# Patient Record
Sex: Female | Born: 1945 | Race: White | Hispanic: No | Smoking: Former smoker
Health system: Southern US, Community
[De-identification: ages and names within clinical notes are randomized; demographics above are authoritative.]

## PROBLEM LIST (undated history)

## (undated) DIAGNOSIS — I10 Essential (primary) hypertension: Secondary | ICD-10-CM

## (undated) DIAGNOSIS — N189 Chronic kidney disease, unspecified: Secondary | ICD-10-CM

## (undated) DIAGNOSIS — E785 Hyperlipidemia, unspecified: Secondary | ICD-10-CM

## (undated) DIAGNOSIS — M1710 Unilateral primary osteoarthritis, unspecified knee: Secondary | ICD-10-CM

## (undated) DIAGNOSIS — M109 Gout, unspecified: Secondary | ICD-10-CM

## (undated) DIAGNOSIS — F329 Major depressive disorder, single episode, unspecified: Secondary | ICD-10-CM

## (undated) DIAGNOSIS — F32A Depression, unspecified: Secondary | ICD-10-CM

## (undated) DIAGNOSIS — N3281 Overactive bladder: Secondary | ICD-10-CM

## (undated) DIAGNOSIS — I639 Cerebral infarction, unspecified: Secondary | ICD-10-CM

## (undated) HISTORY — DX: Chronic kidney disease, unspecified: N18.9

## (undated) HISTORY — DX: Hyperlipidemia, unspecified: E78.5

## (undated) HISTORY — PX: MASTECTOMY PARTIAL / LUMPECTOMY: SUR851

## (undated) HISTORY — DX: Gout, unspecified: M10.9

## (undated) HISTORY — DX: Unilateral primary osteoarthritis, unspecified knee: M17.10

## (undated) HISTORY — DX: Major depressive disorder, single episode, unspecified: F32.9

## (undated) HISTORY — DX: Essential (primary) hypertension: I10

## (undated) HISTORY — DX: Cerebral infarction, unspecified: I63.9

## (undated) HISTORY — DX: Overactive bladder: N32.81

## (undated) HISTORY — DX: Depression, unspecified: F32.A

---

## 1985-07-07 HISTORY — PX: ABDOMINAL HYSTERECTOMY: SHX81

## 2006-10-15 ENCOUNTER — Emergency Department: Payer: Self-pay | Admitting: Emergency Medicine

## 2006-10-26 ENCOUNTER — Emergency Department: Payer: Self-pay | Admitting: Emergency Medicine

## 2009-03-07 ENCOUNTER — Ambulatory Visit: Payer: Self-pay | Admitting: Radiation Oncology

## 2009-03-19 ENCOUNTER — Ambulatory Visit: Payer: Self-pay | Admitting: Radiation Oncology

## 2009-04-06 ENCOUNTER — Ambulatory Visit: Payer: Self-pay | Admitting: Radiation Oncology

## 2009-05-07 ENCOUNTER — Ambulatory Visit: Payer: Self-pay | Admitting: Radiation Oncology

## 2009-06-06 ENCOUNTER — Ambulatory Visit: Payer: Self-pay | Admitting: Radiation Oncology

## 2011-01-06 DIAGNOSIS — N3281 Overactive bladder: Secondary | ICD-10-CM | POA: Insufficient documentation

## 2011-01-06 DIAGNOSIS — I10 Essential (primary) hypertension: Secondary | ICD-10-CM | POA: Insufficient documentation

## 2011-01-06 DIAGNOSIS — I69351 Hemiplegia and hemiparesis following cerebral infarction affecting right dominant side: Secondary | ICD-10-CM | POA: Insufficient documentation

## 2011-12-10 DIAGNOSIS — Z853 Personal history of malignant neoplasm of breast: Secondary | ICD-10-CM | POA: Insufficient documentation

## 2012-03-10 DIAGNOSIS — N1832 Chronic kidney disease, stage 3b: Secondary | ICD-10-CM | POA: Insufficient documentation

## 2012-07-14 DIAGNOSIS — M179 Osteoarthritis of knee, unspecified: Secondary | ICD-10-CM

## 2012-07-14 DIAGNOSIS — M1710 Unilateral primary osteoarthritis, unspecified knee: Secondary | ICD-10-CM

## 2012-07-14 DIAGNOSIS — M1711 Unilateral primary osteoarthritis, right knee: Secondary | ICD-10-CM | POA: Insufficient documentation

## 2012-07-14 HISTORY — DX: Osteoarthritis of knee, unspecified: M17.9

## 2012-07-14 HISTORY — DX: Unilateral primary osteoarthritis, unspecified knee: M17.10

## 2016-08-26 ENCOUNTER — Other Ambulatory Visit: Payer: Self-pay | Admitting: Unknown Physician Specialty

## 2016-08-26 DIAGNOSIS — Z780 Asymptomatic menopausal state: Secondary | ICD-10-CM

## 2016-09-17 ENCOUNTER — Ambulatory Visit
Admission: RE | Admit: 2016-09-17 | Discharge: 2016-09-17 | Disposition: A | Discharge status: Home / Self Care | Payer: Medicare Other | Source: Ambulatory Visit | Attending: Unknown Physician Specialty | Admitting: Unknown Physician Specialty

## 2016-09-17 DIAGNOSIS — M85851 Other specified disorders of bone density and structure, right thigh: Secondary | ICD-10-CM | POA: Insufficient documentation

## 2016-09-17 DIAGNOSIS — Z780 Asymptomatic menopausal state: Secondary | ICD-10-CM | POA: Diagnosis not present

## 2016-09-17 DIAGNOSIS — M8588 Other specified disorders of bone density and structure, other site: Secondary | ICD-10-CM | POA: Insufficient documentation

## 2016-09-22 DIAGNOSIS — M8589 Other specified disorders of bone density and structure, multiple sites: Secondary | ICD-10-CM | POA: Insufficient documentation

## 2016-11-19 DIAGNOSIS — J310 Chronic rhinitis: Secondary | ICD-10-CM | POA: Insufficient documentation

## 2016-12-20 LAB — BASIC METABOLIC PANEL
BUN: 20 (ref 4–21)
Creatinine: 1.3 — AB (ref 0.5–1.1)
GLUCOSE: 110
Potassium: 4.1 (ref 3.4–5.3)
SODIUM: 139 (ref 137–147)

## 2016-12-20 LAB — CBC AND DIFFERENTIAL
HCT: 38 (ref 36–46)
HEMOGLOBIN: 12.5 (ref 12.0–16.0)
PLATELETS: 289 (ref 150–399)
WBC: 8.4

## 2016-12-20 LAB — HEPATIC FUNCTION PANEL
ALK PHOS: 91 (ref 25–125)
ALT: 18 (ref 7–35)
AST: 23 (ref 13–35)
BILIRUBIN, TOTAL: 0.8

## 2016-12-21 LAB — BASIC METABOLIC PANEL
BUN: 23 — AB (ref 4–21)
CREATININE: 1.2 — AB (ref 0.5–1.1)
Glucose: 140
Potassium: 4.5 (ref 3.4–5.3)
Sodium: 141 (ref 137–147)

## 2016-12-21 LAB — CBC AND DIFFERENTIAL
HCT: 39 (ref 36–46)
HEMOGLOBIN: 12.4 (ref 12.0–16.0)
PLATELETS: 268 (ref 150–399)
WBC: 6.8

## 2016-12-22 LAB — BASIC METABOLIC PANEL
BUN: 33 — AB (ref 4–21)
CREATININE: 1.1 (ref 0.5–1.1)
GLUCOSE: 131
POTASSIUM: 4.4 (ref 3.4–5.3)
SODIUM: 138 (ref 137–147)

## 2016-12-22 LAB — CBC AND DIFFERENTIAL
HCT: 37 (ref 36–46)
HEMOGLOBIN: 12.1 (ref 12.0–16.0)
PLATELETS: 277 (ref 150–399)
WBC: 11.3

## 2016-12-22 LAB — HEPATIC FUNCTION PANEL
ALT: 18 (ref 7–35)
AST: 20 (ref 13–35)
Alkaline Phosphatase: 88 (ref 25–125)
Bilirubin, Total: 0.3

## 2016-12-23 LAB — BASIC METABOLIC PANEL
BUN: 36 — AB (ref 4–21)
CREATININE: 1.3 — AB (ref 0.5–1.1)
Glucose: 123
POTASSIUM: 4.8 (ref 3.4–5.3)
Sodium: 141 (ref 137–147)

## 2016-12-23 LAB — CBC AND DIFFERENTIAL
HEMATOCRIT: 38 (ref 36–46)
Hemoglobin: 12.1 (ref 12.0–16.0)
PLATELETS: 270 (ref 150–399)
WBC: 8.5

## 2016-12-24 ENCOUNTER — Encounter
Admission: RE | Admit: 2016-12-24 | Discharge: 2016-12-24 | Disposition: A | Discharge status: Home / Self Care | Payer: Medicare Other | Source: Other Acute Inpatient Hospital | Attending: Internal Medicine | Admitting: Internal Medicine

## 2016-12-24 LAB — BASIC METABOLIC PANEL
BUN: 32 — AB (ref 4–21)
Creatinine: 1.1 (ref 0.5–1.1)
Glucose: 99
POTASSIUM: 4.4 (ref 3.4–5.3)
SODIUM: 141 (ref 137–147)

## 2016-12-31 ENCOUNTER — Non-Acute Institutional Stay (SKILLED_NURSING_FACILITY): Payer: Medicare Other | Admitting: Gerontology

## 2016-12-31 ENCOUNTER — Encounter: Payer: Self-pay | Admitting: Gerontology

## 2016-12-31 DIAGNOSIS — G4700 Insomnia, unspecified: Secondary | ICD-10-CM | POA: Diagnosis not present

## 2016-12-31 DIAGNOSIS — M25561 Pain in right knee: Secondary | ICD-10-CM | POA: Diagnosis not present

## 2016-12-31 DIAGNOSIS — N189 Chronic kidney disease, unspecified: Secondary | ICD-10-CM | POA: Insufficient documentation

## 2016-12-31 DIAGNOSIS — E785 Hyperlipidemia, unspecified: Secondary | ICD-10-CM | POA: Insufficient documentation

## 2016-12-31 DIAGNOSIS — I639 Cerebral infarction, unspecified: Secondary | ICD-10-CM | POA: Insufficient documentation

## 2016-12-31 DIAGNOSIS — I10 Essential (primary) hypertension: Secondary | ICD-10-CM | POA: Insufficient documentation

## 2016-12-31 NOTE — Progress Notes (Signed)
Location:   [facility]   Place of Service:  SNF [phone] Provider:  Lorenso QuarryShannon Damary Doland, NP-C  System, Pcp Not In  Patient Care Team: System, Pcp Not In as PCP - General (Unknown Physician Specialty)  Extended Emergency Contact Information Primary Emergency Contact: Antionette CharMace,Kimberly  [place] Home Phone: [phone] Relation: Daughter Secondary Emergency Contact: Allean FoundLawrance,Kari  [place] Home Phone: [phone] Relation: Relative  Code Status:  Full Goals of care: Advanced Directive information Advanced Directives 12/31/2016  Does Patient Have a Medical Advance Directive? No     Chief Complaint  Patient presents with  . Medical Management of Chronic Issues    Routine Visit    HPI:  Pt is a 71 y.o. female seen today for follow up. Pt was admitted to the facility for rehab for knee pain. Pt had received a cortisone injection in the knee. Therefore, she has to wait a few months before having a knee replacement for knee pain. She has been participating in PT and OT while here. PT is recommending a Rolling walker. However, she is adamant she is going to continue to use her Rollator. Pt was insisting on going home earlier today, but has decided to stay for a few more days. She now c/o difficulty sleeping and had requested a sleep aid. Pt reports she is eating well. She has had some weight loss since admit d/t diuretics. VSS. No other complaints.     Past Medical History:  Diagnosis Date  . Breast cancer (HCC)    unspecified  . Chronic kidney disease   . Depression   . Gout, joint   . Hyperlipidemia, unspecified   . Hypertension   . Osteoarthritis of knee 07/14/2012   right  . Overactive bladder   . Stroke [facility](HCC)    Past Surgical History:  Procedure Laterality Date  . ABDOMINAL HYSTERECTOMY  1987   partial  . MASTECTOMY PARTIAL / LUMPECTOMY      Allergies  Allergen Reactions  . Citalopram     Other reaction(s): Blood Disorder Easy  bruising  . Bupropion Anxiety    150 mg.  Has tolerated 75    Allergies as of 12/31/2016      Reactions   Citalopram    Other reaction(s): Blood Disorder Easy bruising   Bupropion Anxiety   150 mg.  Has tolerated 75      Medication List       Accurate as of 12/31/16  4:35 PM. Always use your most recent med list.          acetaminophen 325 MG tablet Commonly known as:  TYLENOL Take 650 mg by mouth every 8 (eight) hours. At 6 am , 2 pm , 10 pm   aspirin EC 81 MG tablet Take 81 mg by mouth daily. At 8 am   colchicine 0.6 MG tablet Take 0.6 mg by mouth 3 (three) times daily as needed.   esomeprazole 40 MG capsule Commonly known as:  NEXIUM Take 40 mg by mouth daily at 6 (six) AM.   fluocinonide cream 0.05 % Commonly known as:  LIDEX Apply 1 application topically 2 times daily as needed to eczema of left hand.   fluticasone 50 MCG/ACT nasal spray Commonly known as:  FLONASE Place 2 sprays into both nostrils daily. 8 am   furosemide 20 MG tablet Commonly known as:  LASIX Take 20 mg by mouth every other day. 8 am   lidocaine 5 % Commonly known as:  LIDODERM  Place 1 patch onto the skin daily. At 8 am to right knee for pain. Remove patch at 8 pm. Remove & Discard patch within 12 hours or as directed by MD   lisinopril 20 MG tablet Commonly known as:  PRINIVIL,ZESTRIL Take 20 mg by mouth daily. At 8 am   magnesium hydroxide 400 MG/5ML suspension Commonly known as:  MILK OF MAGNESIA Take 30 mLs by mouth See admin instructions. Constipation/no BM for 2 days, every 4 hours prn.   pravastatin 40 MG tablet Commonly known as:  PRAVACHOL Take 40 mg by mouth at bedtime. 8 pm   REFRESH 1.4-0.6 % Soln Generic drug:  Polyvinyl Alcohol-Povidone PF Place 2 drops into the right eye 4 (four) times daily. At 8 am, 1 pm, 5 pm, 8 pm   sertraline 25 MG tablet Commonly known as:  ZOLOFT Take 25 mg by mouth daily. 8 am   traZODone 50 MG tablet Commonly known as:   DESYREL Take 50 mg by mouth at bedtime as needed for sleep.       Review of Systems  Constitutional: Negative for activity change, appetite change, chills, diaphoresis and fever.  HENT: Negative for congestion, sneezing, sore throat, trouble swallowing and voice change.   Respiratory: Negative for apnea, cough, choking, chest tightness, shortness of breath and wheezing.   Cardiovascular: Negative for chest pain, palpitations and leg swelling.  Gastrointestinal: Negative for abdominal distention, abdominal pain, constipation, diarrhea and nausea.  Genitourinary: Negative for difficulty urinating, dysuria, frequency and urgency.  Musculoskeletal: Positive for arthralgias (typical arthritis), gait problem and myalgias. Negative for back pain.  Skin: Negative for color change, pallor, rash and wound.  Neurological: Negative for dizziness, tremors, syncope, speech difficulty, weakness, numbness and headaches.  Psychiatric/Behavioral: Negative for agitation and behavioral problems.  All other systems reviewed and are negative.   Immunization History  Administered Date(s) Administered  . DT 10/06/2006  . Influenza, High Dose Seasonal PF 05/05/2014, 03/31/2016  . Influenza,inj,Quad PF,36+ Mos 03/21/2015  . Influenza-Unspecified 07/14/2012  . Pneumococcal Conjugate-13 09/12/2013  . Pneumococcal Polysaccharide-23 09/13/2003, 03/21/2015  . Tdap 01/01/2011   There are no preventive care reminders to display for this patient. No flowsheet data found. Functional Status Survey:    Vitals:   12/31/16 1507  BP: (!) 163/87  Pulse: 85  Resp: 17  Temp: 97.8 F (36.6 C)  SpO2: 100%  Weight: 216 lb 12.8 oz (98.3 kg)  Height: 5\' 1"  (1.549 m)   Body mass index is 40.96 kg/m. Physical Exam  Constitutional: She is oriented to person, place, and time. Vital signs are normal. She appears well-developed and well-nourished. She is active and cooperative. She does not appear ill. No distress.   HENT:  Head: Normocephalic and atraumatic.  Mouth/Throat: Uvula is midline, oropharynx is clear and moist and mucous membranes are normal. Mucous membranes are not pale, not dry and not cyanotic.  Eyes: Conjunctivae, EOM and lids are normal. Pupils are equal, round, and reactive to light.  Neck: Trachea normal, normal range of motion and full passive range of motion without pain. Neck supple. No JVD present. No tracheal deviation, no edema and no erythema present. No thyromegaly present.  Cardiovascular: Normal rate, regular rhythm, normal heart sounds, intact distal pulses and normal pulses.  Exam reveals no gallop, no distant heart sounds and no friction rub.   No murmur heard. Pulses:      Dorsalis pedis pulses are 2+ on the right side, and 2+ on the left side.  2+ BLE  edema  Pulmonary/Chest: Effort normal and breath sounds normal. No accessory muscle usage. No respiratory distress. She has no decreased breath sounds. She has no wheezes. She has no rhonchi. She has no rales. She exhibits no tenderness.  Abdominal: Normal appearance and bowel sounds are normal. She exhibits no distension and no ascites. There is no tenderness.  Musculoskeletal: She exhibits no edema.       Right knee: She exhibits decreased range of motion. Tenderness found.  Expected osteoarthritis, stiffness. Calves soft, supple. Negative Homan's sign  Neurological: She is alert and oriented to person, place, and time. She has normal strength.  Skin: Skin is warm, dry and intact. No rash noted. She is not diaphoretic. No cyanosis or erythema. No pallor. Nails show no clubbing.  Psychiatric: She has a normal mood and affect. Her speech is normal and behavior is normal. Judgment and thought content normal. Cognition and memory are normal.  Nursing note and vitals reviewed.   Labs reviewed:  Recent Labs  12/22/16 12/23/16 12/24/16  NA 138 141 141  K 4.4 4.8 4.4  BUN 33* 36* 32*  CREATININE 1.1 1.3* 1.1    Recent  Labs  12/20/16 12/22/16  AST 23 20  ALT 18 18  ALKPHOS 91 88    Recent Labs  12/21/16 12/22/16 12/23/16  WBC 6.8 11.3 8.5  HGB 12.4 12.1 12.1  HCT 39 37 38  PLT 268 277 270   No results found for: TSH No results found for: HGBA1C No results found for: CHOL, HDL, LDLCALC, LDLDIRECT, TRIG, CHOLHDL  Significant Diagnostic Results in last 30 days:  No results found.  Assessment/Plan 1. Right knee pain, unspecified chronicity  Continue PT/OT  Continue exercises as taught by PT/OT  Continue Lidocaine 5% patch to the right knee Q Day, Remove after 12 hours.  Ice prn for pain, edema  2. Insomnia, unspecified type  Trazodone 50 mg po Q HS prn  Family/ staff Communication:   Total Time:  Documentation:  Face to Face:  Family/Phone:   Labs/tests ordered:    Medication list reviewed and assessed for continued appropriateness. Monthly medication orders reviewed and signed.  Brynda Rim, NP-C Geriatrics [facility] [phone] [place], [place] [address] Cell Phone (Mon-Fri 8am-5pm):  [phone] On Call:  [phone] & follow prompts after 5pm & weekends Office Phone:  [phone] Office Fax:  [phone]

## 2017-01-04 ENCOUNTER — Encounter
Admission: RE | Admit: 2017-01-04 | Discharge: 2017-01-04 | Disposition: A | Discharge status: Home / Self Care | Payer: Medicare Other | Source: Ambulatory Visit | Attending: Internal Medicine | Admitting: Internal Medicine

## 2017-01-06 ENCOUNTER — Non-Acute Institutional Stay (SKILLED_NURSING_FACILITY): Payer: Medicare Other | Admitting: Gerontology

## 2017-01-06 DIAGNOSIS — M25561 Pain in right knee: Secondary | ICD-10-CM

## 2017-01-06 DIAGNOSIS — G4700 Insomnia, unspecified: Secondary | ICD-10-CM

## 2017-01-08 ENCOUNTER — Encounter: Payer: Self-pay | Admitting: Gerontology

## 2017-01-08 NOTE — Progress Notes (Signed)
Location:   [facility] Room Number: 201A Place of Service:  SNF [phone])  Provider: Lorenso Quarry, NP-C  PCP: Lauro Regulus, MD Patient Care Team: Lauro Regulus, MD as PCP - General (Internal Medicine)  Extended Emergency Contact Information Primary Emergency Contact: Antionette Char [place] Home Phone: [phone] Relation: Daughter Secondary Emergency Contact: Allean Found [place] Home Phone: [phone] Mobile Phone: [phone] Relation: Daughter  Code Status: Full Goals of care:  Advanced Directive information Advanced Directives 01/06/2017  Does Patient Have a Medical Advance Directive? No     Allergies  Allergen Reactions  . Citalopram     Other reaction(s): Blood Disorder Easy bruising  . Bupropion Anxiety    150 mg.  Has tolerated 75    Chief Complaint  Patient presents with  . Discharge Note    Discharged from SNF    HPI:  71 y.o. female seen today for discharge evaluation. Pt was admitted to the facility for rehab for knee pain. Pt had received a cortisone injection in the knee. Therefore, she has to wait a few months before having a knee replacement for knee pain. She has been participating in PT and OT while here. PT is recommending a Rolling walker. However, she is adamant she is going to continue to use her Rollator. She had c/o difficulty sleeping and had requested a sleep aid. Pt reports she is eating well. She has had some weight loss since admit d/t diuretics. Pt reports she feels she is ready for discharge. VSS. No other complaints.     Past Medical History:  Diagnosis Date  . Breast cancer (HCC)    unspecified  . Chronic kidney disease   . Depression   . Gout, joint   . Hyperlipidemia, unspecified   . Hypertension   . Osteoarthritis of knee 07/14/2012   right  . Overactive bladder   . Stroke [facility])     Past Surgical History:  Procedure Laterality Date  .  ABDOMINAL HYSTERECTOMY  1987   partial  . MASTECTOMY PARTIAL / LUMPECTOMY        reports that she quit smoking about 14 years ago. Her smoking use included Cigarettes. She has a 30.00 pack-year smoking history. She has never used smokeless tobacco. Her alcohol and drug histories are not on file. Social History   Social History  . Marital status: Divorced    Spouse name: N/A  . Number of children: N/A  . Years of education: N/A   Occupational History  . Not on file.   Social History Main Topics  . Smoking status: Former Smoker    Packs/day: 1.50    Years: 20.00    Types: Cigarettes    Quit date: 04/23/2002  . Smokeless tobacco: Never Used  . Alcohol use Not on file  . Drug use: Unknown  . Sexual activity: Not on file   Other Topics Concern  . Not on file   Social History Narrative  . No narrative on file   Functional Status Survey:    Allergies  Allergen Reactions  . Citalopram     Other reaction(s): Blood Disorder Easy bruising  . Bupropion Anxiety    150 mg.  Has tolerated 75    There are no preventive care reminders to display for this patient.  Medications: Allergies as of 01/06/2017      Reactions   Citalopram    Other reaction(s): Blood Disorder Easy bruising   Bupropion Anxiety  150 mg.  Has tolerated 75      Medication List       Accurate as of 01/06/17 11:59 PM. Always use your most recent med list.          aspirin EC 81 MG tablet Take 81 mg by mouth daily. At 8 am   colchicine 0.6 MG tablet Take 0.6 mg by mouth 3 (three) times daily as needed.   esomeprazole 40 MG capsule Commonly known as:  NEXIUM Take 40 mg by mouth daily at 6 (six) AM.   fluocinonide cream 0.05 % Commonly known as:  LIDEX Apply 1 application topically 2 times daily as needed to eczema of left hand.   fluticasone 50 MCG/ACT nasal spray Commonly known as:  FLONASE Place 2 sprays into both nostrils daily. 8 am   furosemide 20 MG tablet Commonly known as:   LASIX Take 20 mg by mouth every other day. 8 am   lidocaine 5 % Commonly known as:  LIDODERM Place 1 patch onto the skin daily. At 8 am to right knee for pain. Remove patch at 8 pm. Remove & Discard patch within 12 hours or as directed by MD   lisinopril 20 MG tablet Commonly known as:  PRINIVIL,ZESTRIL Take 20 mg by mouth daily. At 8 am   magnesium hydroxide 400 MG/5ML suspension Commonly known as:  MILK OF MAGNESIA Take 30 mLs by mouth See admin instructions. Constipation/no BM for 2 days, every 4 hours prn.   pravastatin 40 MG tablet Commonly known as:  PRAVACHOL Take 40 mg by mouth at bedtime. 8 pm   REFRESH 1.4-0.6 % Soln Generic drug:  Polyvinyl Alcohol-Povidone PF Place 2 drops into the right eye 4 (four) times daily. At 8 am, 1 pm, 5 pm, 8 pm   sertraline 25 MG tablet Commonly known as:  ZOLOFT Take 25 mg by mouth daily. 8 am   traZODone 50 MG tablet Commonly known as:  DESYREL Take 50 mg by mouth at bedtime as needed for sleep.       Review of Systems  Constitutional: Negative for activity change, appetite change, chills, diaphoresis and fever.  HENT: Negative for congestion, sneezing, sore throat, trouble swallowing and voice change.   Respiratory: Negative for apnea, cough, choking, chest tightness, shortness of breath and wheezing.   Cardiovascular: Negative for chest pain, palpitations and leg swelling.  Gastrointestinal: Negative for abdominal distention, abdominal pain, constipation, diarrhea and nausea.  Genitourinary: Negative for difficulty urinating, dysuria, frequency and urgency.  Musculoskeletal: Positive for arthralgias (typical arthritis), gait problem and myalgias. Negative for back pain.  Skin: Negative for color change, pallor, rash and wound.  Neurological: Negative for dizziness, tremors, syncope, speech difficulty, weakness, numbness and headaches.  Psychiatric/Behavioral: Negative for agitation and behavioral problems.  All other systems  reviewed and are negative.   Vitals:   01/06/17 0941  BP: 116/74  Pulse: 66  Resp: 20  Temp: 98.4 F (36.9 C)  SpO2: 100%  Weight: 223 lb (101.2 kg)  Height: 5\' 1"  (1.549 m)   Body mass index is 42.14 kg/m. Physical Exam  Constitutional: She is oriented to person, place, and time. Vital signs are normal. She appears well-developed and well-nourished. She is active and cooperative. She does not appear ill. No distress.  HENT:  Head: Normocephalic and atraumatic.  Mouth/Throat: Uvula is midline, oropharynx is clear and moist and mucous membranes are normal. Mucous membranes are not pale, not dry and not cyanotic.  Eyes: Conjunctivae, EOM and lids  are normal. Pupils are equal, round, and reactive to light.  Neck: Trachea normal, normal range of motion and full passive range of motion without pain. Neck supple. No JVD present. No tracheal deviation, no edema and no erythema present. No thyromegaly present.  Cardiovascular: Normal rate, regular rhythm, normal heart sounds, intact distal pulses and normal pulses.  Exam reveals no gallop, no distant heart sounds and no friction rub.   No murmur heard. Pulses:      Dorsalis pedis pulses are 2+ on the right side, and 2+ on the left side.  2+ BLE edema  Pulmonary/Chest: Effort normal and breath sounds normal. No accessory muscle usage. No respiratory distress. She has no decreased breath sounds. She has no wheezes. She has no rhonchi. She has no rales. She exhibits no tenderness.  Abdominal: Normal appearance and bowel sounds are normal. She exhibits no distension and no ascites. There is no tenderness.  Musculoskeletal: She exhibits no edema.       Right knee: She exhibits decreased range of motion. Tenderness found.  Expected osteoarthritis, stiffness. Calves soft, supple. Negative Homan's sign  Neurological: She is alert and oriented to person, place, and time. She has normal strength.  Skin: Skin is warm, dry and intact. No rash noted.  She is not diaphoretic. No cyanosis or erythema. No pallor. Nails show no clubbing.  Psychiatric: She has a normal mood and affect. Her speech is normal and behavior is normal. Judgment and thought content normal. Cognition and memory are normal.  Nursing note and vitals reviewed.   Labs reviewed: Basic Metabolic Panel:  Recent Labs  16/10/96 12/23/16 12/24/16  NA 138 141 141  K 4.4 4.8 4.4  BUN 33* 36* 32*  CREATININE 1.1 1.3* 1.1   Liver Function Tests:  Recent Labs  12/20/16 12/22/16  AST 23 20  ALT 18 18  ALKPHOS 91 88   No results for input(s): LIPASE, AMYLASE in the last 8760 hours. No results for input(s): AMMONIA in the last 8760 hours. CBC:  Recent Labs  12/21/16 12/22/16 12/23/16  WBC 6.8 11.3 8.5  HGB 12.4 12.1 12.1  HCT 39 37 38  PLT 268 277 270   Cardiac Enzymes: No results for input(s): CKTOTAL, CKMB, CKMBINDEX, TROPONINI in the last 8760 hours. BNP: Invalid input(s): POCBNP CBG: No results for input(s): GLUCAP in the last 8760 hours.  Procedures and Imaging Studies During Stay: No results found.  Assessment/Plan:   1. Right knee pain, unspecified chronicity  Continue PT/OT  Continue exercises as taught by PT/OT  Continue Lidocaine 5% patch to the right knee Q Day, Remove after 12 hours.  Ice prn for pain, edema  Follow up with orthopedist asap after discharge for continuity of care  2. Insomnia, unspecified type Trazodone 50 mg po Q HS prn   Patient is being discharged with the following home health services: HHPT/OT    Patient is being discharged with the following durable medical equipment: rolling walker   Patient has been advised to f/u with their PCP in 1-2 weeks to bring them up to date on their rehab stay.  Social services at facility was responsible for arranging this appointment.  Pt was provided with a 30 day supply of prescriptions for medications and refills must be obtained from their PCP.  For controlled substances, a more  limited supply may be provided adequate until PCP appointment only.  Future labs/tests needed:    Family/ staff Communication:   Total Time:  Documentation:  Face to  Face:  Family/Phone:  Brynda RimShannon H. Amran Malter, NP-C Geriatrics [facility] [phone] [address] [place], [place] [id number] Cell Phone (Mon-Fri 8am-5pm):  [phone] On Call:  [phone] & follow prompts after 5pm & weekends Office Phone:  [phone] Office Fax:  [phone]

## 2017-02-26 ENCOUNTER — Emergency Department: Payer: Medicare Other

## 2017-02-26 ENCOUNTER — Emergency Department
Admission: EM | Admit: 2017-02-26 | Discharge: 2017-02-26 | Disposition: A | Discharge status: Home / Self Care | Payer: Medicare Other | Attending: Student in an Organized Health Care Education/Training Program | Admitting: Student in an Organized Health Care Education/Training Program

## 2017-02-26 ENCOUNTER — Encounter: Payer: Self-pay | Admitting: Emergency Medicine

## 2017-02-26 DIAGNOSIS — R1031 Right lower quadrant pain: Secondary | ICD-10-CM | POA: Insufficient documentation

## 2017-02-26 DIAGNOSIS — Z7982 Long term (current) use of aspirin: Secondary | ICD-10-CM | POA: Diagnosis not present

## 2017-02-26 DIAGNOSIS — Z79899 Other long term (current) drug therapy: Secondary | ICD-10-CM | POA: Diagnosis not present

## 2017-02-26 DIAGNOSIS — Z87891 Personal history of nicotine dependence: Secondary | ICD-10-CM | POA: Diagnosis not present

## 2017-02-26 DIAGNOSIS — I129 Hypertensive chronic kidney disease with stage 1 through stage 4 chronic kidney disease, or unspecified chronic kidney disease: Secondary | ICD-10-CM | POA: Diagnosis not present

## 2017-02-26 DIAGNOSIS — N189 Chronic kidney disease, unspecified: Secondary | ICD-10-CM | POA: Diagnosis not present

## 2017-02-26 LAB — COMPREHENSIVE METABOLIC PANEL
ALBUMIN: 4 g/dL (ref 3.5–5.0)
ALK PHOS: 92 U/L (ref 38–126)
ALT: 15 U/L (ref 14–54)
AST: 19 U/L (ref 15–41)
Anion gap: 7 (ref 5–15)
BILIRUBIN TOTAL: 0.5 mg/dL (ref 0.3–1.2)
BUN: 21 mg/dL — AB (ref 6–20)
CALCIUM: 9.3 mg/dL (ref 8.9–10.3)
CO2: 29 mmol/L (ref 22–32)
CREATININE: 1.3 mg/dL — AB (ref 0.44–1.00)
Chloride: 103 mmol/L (ref 101–111)
GFR calc Af Amer: 47 mL/min — ABNORMAL LOW (ref >60)
GFR calc non Af Amer: 40 mL/min — ABNORMAL LOW (ref >60)
GLUCOSE: 122 mg/dL — AB (ref 65–99)
Potassium: 4.3 mmol/L (ref 3.5–5.1)
SODIUM: 139 mmol/L (ref 135–145)
Total Protein: 7.5 g/dL (ref 6.5–8.1)

## 2017-02-26 LAB — URINALYSIS, COMPLETE (UACMP) WITH MICROSCOPIC
Bacteria, UA: NONE SEEN
Bilirubin Urine: NEGATIVE
GLUCOSE, UA: NEGATIVE mg/dL
HGB URINE DIPSTICK: NEGATIVE
Ketones, ur: NEGATIVE mg/dL
Leukocytes, UA: NEGATIVE
Nitrite: NEGATIVE
PH: 6 (ref 5.0–8.0)
Protein, ur: 30 mg/dL — AB
SPECIFIC GRAVITY, URINE: 1.021 (ref 1.005–1.030)

## 2017-02-26 LAB — CBC
HCT: 39.8 % (ref 35.0–47.0)
Hemoglobin: 13.2 g/dL (ref 12.0–16.0)
MCH: 30.5 pg (ref 26.0–34.0)
MCHC: 33.1 g/dL (ref 32.0–36.0)
MCV: 92.2 fL (ref 80.0–100.0)
PLATELETS: 231 10*3/uL (ref 150–440)
RBC: 4.32 MIL/uL (ref 3.80–5.20)
RDW: 12.9 % (ref 11.5–14.5)
WBC: 6.2 10*3/uL (ref 3.6–11.0)

## 2017-02-26 LAB — LIPASE, BLOOD: Lipase: 45 U/L (ref 11–51)

## 2017-02-26 MED ORDER — SODIUM CHLORIDE 0.9 % IV BOLUS (SEPSIS)
1000.0000 mL | Freq: Once | INTRAVENOUS | Status: AC
  Administered 2017-02-26: 1000 mL via INTRAVENOUS

## 2017-02-26 MED ORDER — FENTANYL CITRATE (PF) 100 MCG/2ML IJ SOLN
INTRAMUSCULAR | Status: AC
  Filled 2017-02-26: qty 2

## 2017-02-26 MED ORDER — IOPAMIDOL (ISOVUE-370) INJECTION 76%
75.0000 mL | Freq: Once | INTRAVENOUS | Status: AC | PRN
  Administered 2017-02-26: 75 mL via INTRAVENOUS

## 2017-02-26 MED ORDER — FENTANYL CITRATE (PF) 100 MCG/2ML IJ SOLN
50.0000 ug | Freq: Once | INTRAMUSCULAR | Status: AC
Start: 2017-02-26 — End: 2017-02-26
  Administered 2017-02-26: 50 ug via INTRAVENOUS

## 2017-02-26 MED ORDER — POLYETHYLENE GLYCOL 3350 17 G PO PACK: 17.0000 g | PACK | Freq: Every day | ORAL | 0 refills | Status: DC

## 2017-02-26 MED ORDER — TRAMADOL HCL 50 MG PO TABS: 50.0000 mg | ORAL_TABLET | Freq: Four times a day (QID) | ORAL | 0 refills | Status: DC | PRN

## 2017-02-26 MED ORDER — LIDOCAINE 5 % EX PTCH: 1.0000 | MEDICATED_PATCH | Freq: Two times a day (BID) | CUTANEOUS | 0 refills | Status: AC

## 2017-02-26 NOTE — ED Notes (Signed)
First nurse note: Pt arrived via EMS from home for reports of LLQ abdominal pain for 3-4 days with associated loose stools. Pt reports has been taking tramadol for 15 days but stopped last night. EMS reports VSS.

## 2017-02-26 NOTE — Discharge Instructions (Signed)

## 2017-02-26 NOTE — ED Notes (Signed)
Patient transported to CT 

## 2017-02-26 NOTE — ED Triage Notes (Signed)
Pt with right abd pain that started 1 week ago. Pain has increased and now is radiating down right leg. Pt hx stroke affecting right side 2006.

## 2017-02-26 NOTE — ED Provider Notes (Signed)
[facility] Emergency Department Provider Note    First MD Initiated Contact with Patient 02/26/17 1940     (approximate)  I have reviewed the triage vital signs and the nursing notes.   HISTORY  Chief Complaint Abdominal Pain    HPI Doris Curry is a 71 y.o. female Presents with chief complaint of one week of right sided, sharp, lower abdominal pain radiating into the top part of her right thigh. States the pain has been relatively constant and is worse with movement. States that she went 3 days without moving her bowels but had diarrhea today. No fevers. No nausea or vomiting. No chest pain or shortness of breath. No history of kidney stones. No dysuria or hematuria. No blood in her stools.   Past Medical History:  Diagnosis Date  . Chronic kidney disease   . Depression   . Gout, joint   . Hyperlipidemia, unspecified   . Hypertension   . Osteoarthritis of knee 07/14/2012   right  . Overactive bladder   . Stroke [facility])    Family History  Problem Relation Age of Onset  . Breast cancer Mother   . Alzheimer's disease Mother   . Hypertension Father   . Kidney disease Father   . Stroke Father   . Stroke Paternal Grandfather   . Diabetes type II Paternal Grandfather   . Diabetes type II Paternal Aunt   . Diabetes type II Daughter    Past Surgical History:  Procedure Laterality Date  . ABDOMINAL HYSTERECTOMY  1987   partial  . MASTECTOMY PARTIAL / LUMPECTOMY     Patient Active Problem List   Diagnosis Date Noted  . Hypertension   . Chronic kidney disease   . Hyperlipidemia, unspecified   . Stroke [facility])       Prior to Admission medications   Medication Sig Start Date End Date Taking? Authorizing Provider  aspirin EC 81 MG tablet Take 81 mg by mouth daily. At 8 am    [provider]  colchicine 0.6 MG tablet Take 0.6 mg by mouth 3 (three) times daily as needed.    [provider]  esomeprazole (NEXIUM) 40 MG capsule  Take 40 mg by mouth daily at 6 (six) AM.    [provider]  fluocinonide cream (LIDEX) 0.05 % Apply 1 application topically 2 times daily as needed to eczema of left hand.    [provider]  fluticasone (FLONASE) 50 MCG/ACT nasal spray Place 2 sprays into both nostrils daily. 8 am    [provider]  furosemide (LASIX) 20 MG tablet Take 20 mg by mouth every other day. 8 am    [provider]  lidocaine (LIDODERM) 5 % Place 1 patch onto the skin daily. At 8 am to right knee for pain. Remove patch at 8 pm. Remove & Discard patch within 12 hours or as directed by MD    [provider]  lidocaine (LIDODERM) 5 % Place 1 patch onto the skin every 12 (twelve) hours. Remove & Discard patch within 12 hours or as directed by MD 02/26/17 02/26/18  Willy Eddy, MD  lisinopril (PRINIVIL,ZESTRIL) 20 MG tablet Take 20 mg by mouth daily. At 8 am    [provider]  magnesium hydroxide (MILK OF MAGNESIA) 400 MG/5ML suspension Take 30 mLs by mouth See admin instructions. Constipation/no BM for 2 days, every 4 hours prn.    [provider]  polyethylene glycol (MIRALAX / GLYCOLAX) packet Take  17 g by mouth daily. Mix one tablespoon with 8oz of your favorite juice or water every day until you are having soft formed stools. Then start taking once daily if you didn't have a stool the day before. 02/26/17   Willy Eddy, MD  Polyvinyl Alcohol-Povidone PF (REFRESH) 1.4-0.6 % SOLN Place 2 drops into the right eye 4 (four) times daily. At 8 am, 1 pm, 5 pm, 8 pm    [provider]  pravastatin (PRAVACHOL) 40 MG tablet Take 40 mg by mouth at bedtime. 8 pm    [provider]  sertraline (ZOLOFT) 25 MG tablet Take 25 mg by mouth daily. 8 am    [provider]  traMADol (ULTRAM) 50 MG tablet Take 1 tablet (50 mg total) by mouth every 6 (six) hours as needed. 02/26/17 02/26/18  Willy Eddy, MD  traZODone (DESYREL) 50 MG tablet Take  50 mg by mouth at bedtime as needed for sleep.    [provider]    Allergies Citalopram and Bupropion    Social History Social History  Substance Use Topics  . Smoking status: Former Smoker    Packs/day: 1.50    Years: 20.00    Types: Cigarettes    Quit date: 04/23/2002  . Smokeless tobacco: Never Used  . Alcohol use No    Review of Systems Patient denies headaches, rhinorrhea, blurry vision, numbness, shortness of breath, chest pain, edema, cough, abdominal pain, nausea, vomiting, diarrhea, dysuria, fevers, rashes or hallucinations unless otherwise stated above in HPI. ____________________________________________   PHYSICAL EXAM:  VITAL SIGNS: Vitals:   02/26/17 2030 02/26/17 2100  BP: (!) 151/76 (!) 158/97  Pulse:  86  Temp:    SpO2:  97%    Constitutional: Alert and oriented. Well appearing and in no acute distress. Eyes: Conjunctivae are normal.  Head: Atraumatic. Nose: No congestion/rhinnorhea. Mouth/Throat: Mucous membranes are moist.   Neck: No stridor. Painless ROM.  Cardiovascular: Normal rate, regular rhythm. Grossly normal heart sounds.  Good peripheral circulation. Respiratory: Normal respiratory effort.  No retractions. Lungs CTAB. Gastrointestinal: Soft, obese with ttp of RLQ ttp. No distention. No abdominal bruits. No CVA tenderness. No rashes, no hernia Genitourinary:  Musculoskeletal: No lower extremity tenderness nor edema.  No joint effusions. Neurologic:  Normal speech and language. No gross focal neurologic deficits are appreciated. No facial droop Skin:  Skin is warm, dry and intact. No rash noted. Psychiatric: Mood and affect are normal. Speech and behavior are normal.  ____________________________________________   LABS (all labs ordered are listed, but only abnormal results are displayed)  Results for orders placed or performed during the hospital encounter of 02/26/17 (from the past 24 hour(s))  Lipase, blood     Status:  None   Collection Time: 02/26/17  6:38 PM  Result Value Ref Range   Lipase 45 11 - 51 U/L  Comprehensive metabolic panel     Status: Abnormal   Collection Time: 02/26/17  6:38 PM  Result Value Ref Range   Sodium 139 135 - 145 mmol/L   Potassium 4.3 3.5 - 5.1 mmol/L   Chloride 103 101 - 111 mmol/L   CO2 29 22 - 32 mmol/L   Glucose, Bld 122 (H) 65 - 99 mg/dL   BUN 21 (H) 6 - 20 mg/dL   Creatinine, Ser 9.79 (H) 0.44 - 1.00 mg/dL   Calcium 9.3 8.9 - 89.2 mg/dL   Total Protein 7.5 6.5 - 8.1 g/dL   Albumin 4.0 3.5 - 5.0 g/dL  AST 19 15 - 41 U/L   ALT 15 14 - 54 U/L   Alkaline Phosphatase 92 38 - 126 U/L   Total Bilirubin 0.5 0.3 - 1.2 mg/dL   GFR calc non Af Amer 40 (L) >60 mL/min   GFR calc Af Amer 47 (L) >60 mL/min   Anion gap 7 5 - 15  CBC     Status: None   Collection Time: 02/26/17  6:38 PM  Result Value Ref Range   WBC 6.2 3.6 - 11.0 K/uL   RBC 4.32 3.80 - 5.20 MIL/uL   Hemoglobin 13.2 12.0 - 16.0 g/dL   HCT 16.1 09.6 - 04.5 %   MCV 92.2 80.0 - 100.0 fL   MCH 30.5 26.0 - 34.0 pg   MCHC 33.1 32.0 - 36.0 g/dL   RDW 40.9 81.1 - 91.4 %   Platelets 231 150 - 440 K/uL  Urinalysis, Complete w Microscopic     Status: Abnormal   Collection Time: 02/26/17  6:38 PM  Result Value Ref Range   Color, Urine YELLOW (A) YELLOW   APPearance CLEAR (A) CLEAR   Specific Gravity, Urine 1.021 1.005 - 1.030   pH 6.0 5.0 - 8.0   Glucose, UA NEGATIVE NEGATIVE mg/dL   Hgb urine dipstick NEGATIVE NEGATIVE   Bilirubin Urine NEGATIVE NEGATIVE   Ketones, ur NEGATIVE NEGATIVE mg/dL   Protein, ur 30 (A) NEGATIVE mg/dL   Nitrite NEGATIVE NEGATIVE   Leukocytes, UA NEGATIVE NEGATIVE   RBC / HPF 0-5 0 - 5 RBC/hpf   WBC, UA 0-5 0 - 5 WBC/hpf   Bacteria, UA NONE SEEN NONE SEEN   Squamous Epithelial / LPF 0-5 (A) NONE SEEN   ____________________________________________  ____________________________________________  RADIOLOGY  I personally reviewed all radiographic images ordered to evaluate for  the above acute complaints and reviewed radiology reports and findings.  These findings were personally discussed with the patient.  Please see medical record for radiology report.  ____________________________________________   PROCEDURES  Procedure(s) performed:  Procedures    Critical Care performed: no ____________________________________________   INITIAL IMPRESSION / ASSESSMENT AND PLAN / ED COURSE  Pertinent labs & imaging results that were available during my care of the patient were reviewed by me and considered in my medical decision making (see chart for details).  DDX: appy, AAA< stone, uti, colitis, hernia, msk strain, neuropathy,  Doris Curry is a 71 y.o. who presents to the ED with right lower quadrant abdominal pain as described as above. She otherwise well-appearing and in no acute distress. Blood reassuring. N EVIDENCE OF UTI or hemNo leukocytosis. CT imaging ordered for the above differential shows no acute abnormality. I did discuss the findings with relation to the pancreas and liver with the patient. Repeat abdominal exam is soft and benign. Spine do feel that she is stable for follow-up with PCP.  Patient was able to tolerate PO and was able to ambulate with a steady gait.       ____________________________________________   FINAL CLINICAL IMPRESSION(S) / ED DIAGNOSES  Final diagnoses:  Right lower quadrant abdominal pain      NEW MEDICATIONS STARTED DURING THIS VISIT:  New Prescriptions   LIDOCAINE (LIDODERM) 5 %    Place 1 patch onto the skin every 12 (twelve) hours. Remove & Discard patch within 12 hours or as directed by MD   POLYETHYLENE GLYCOL (MIRALAX / GLYCOLAX) PACKET    Take 17 g by mouth daily. Mix one tablespoon with 8oz of your favorite juice  or water every day until you are having soft formed stools. Then start taking once daily if you didn't have a stool the day before.   TRAMADOL (ULTRAM) 50 MG TABLET    Take 1 tablet (50 mg total)  by mouth every 6 (six) hours as needed.     Note:  This document was prepared using Dragon voice recognition software and may include unintentional dictation errors.    Willy Eddy, MD 02/26/17 2156

## 2017-02-26 NOTE — ED Notes (Signed)

## 2017-04-07 ENCOUNTER — Other Ambulatory Visit (INDEPENDENT_AMBULATORY_CARE_PROVIDER_SITE_OTHER): Payer: Self-pay | Admitting: Vascular Surgery

## 2017-04-07 DIAGNOSIS — M7989 Other specified soft tissue disorders: Secondary | ICD-10-CM

## 2017-04-08 ENCOUNTER — Ambulatory Visit (INDEPENDENT_AMBULATORY_CARE_PROVIDER_SITE_OTHER): Payer: Medicare Other

## 2017-04-08 DIAGNOSIS — M7989 Other specified soft tissue disorders: Secondary | ICD-10-CM | POA: Diagnosis not present

## 2017-04-09 ENCOUNTER — Encounter (INDEPENDENT_AMBULATORY_CARE_PROVIDER_SITE_OTHER): Payer: Self-pay | Admitting: Vascular Surgery

## 2017-04-09 ENCOUNTER — Ambulatory Visit (INDEPENDENT_AMBULATORY_CARE_PROVIDER_SITE_OTHER): Payer: Medicare Other | Admitting: Vascular Surgery

## 2017-04-09 VITALS — BP 127/71 | HR 77 | Resp 16 | Ht 61.0 in | Wt 219.0 lb

## 2017-04-09 DIAGNOSIS — I159 Secondary hypertension, unspecified: Secondary | ICD-10-CM

## 2017-04-09 DIAGNOSIS — N189 Chronic kidney disease, unspecified: Secondary | ICD-10-CM | POA: Diagnosis not present

## 2017-04-09 DIAGNOSIS — R600 Localized edema: Secondary | ICD-10-CM

## 2017-04-09 DIAGNOSIS — E785 Hyperlipidemia, unspecified: Secondary | ICD-10-CM | POA: Diagnosis not present

## 2017-04-09 NOTE — Progress Notes (Signed)
Subjective:    Patient ID: Doris Curry, female    DOB: 21-May-1946, 71 y.o.   MRN: [id number] Chief Complaint  Patient presents with  . New Patient (Initial Visit)    LE edema   Presents as a new patient referred by Dr. Ernest Pine. Patient will be undergoing a total knee replacement and Dr. Ernest Pine would like her optimized. Patient presents with a chief complaint of bilateral lower extremity edema. States this edema has been intermittent for many years. States her edema has progressive the point she will weep fluid from her legs. She denies any open wounds at this time. The patient sleeps sitting in a chair in a dependent position. She is in the process of requiring a hospital bed. Patient with minimal ambulation at this time. Patient states the edema is associated with discomfort. Her edema and discomfort have progressive the point she is unable to function on a daily basis. Patient states she is unable to wear compression stockings as they are too painful. She also has trouble putting them on. She does try to engage and elevation of the best she can. Patient underwent a bilateral ABI which was notable for triphasic tibials bilaterally and no significant lower extremity arterial disease. Bilateral toe brachial indices were normal. Patient denies any fever, nausea or vomiting.   Review of Systems  Constitutional: Negative.   HENT: Negative.   Eyes: Negative.   Respiratory: Negative.   Cardiovascular: Positive for leg swelling.  Gastrointestinal: Negative.   Endocrine: Negative.   Genitourinary: Negative.   Musculoskeletal: Negative.   Skin: Negative.   Allergic/Immunologic: Negative.   Neurological: Negative.   Hematological: Negative.   Psychiatric/Behavioral: Negative.       Objective:   Physical Exam  Constitutional: She is oriented to person, place, and time. She appears well-developed and well-nourished. No distress.  Obese  HENT:  Head: Normocephalic and atraumatic.  Eyes:  Pupils are equal, round, and reactive to light. Conjunctivae are normal.  Neck: Normal range of motion.  Cardiovascular: Normal rate, regular rhythm, normal heart sounds and intact distal pulses.   Pulses:      Radial pulses are 2+ on the right side, and 2+ on the left side.  Hard to palpate pedal pulses however her bilateral feet are warm  Pulmonary/Chest: Effort normal.  Musculoskeletal: She exhibits edema (Moderate bilateral 1+ pitting edema noted).  Neurological: She is alert and oriented to person, place, and time.  Skin: Skin is warm and dry. She is not diaphoretic.  Right lower extremity: Small scattered scabs noted along the front of the shin Left lower extremity: No open wounds Stasis dermatitis to the bilateral lower extremity  Psychiatric: She has a normal mood and affect. Her behavior is normal. Judgment and thought content normal.  Vitals reviewed.  BP 127/71 (BP Location: Right Arm)   Pulse 77   Resp 16   Ht  (1.549 m)   Wt 219 lb (99.3 kg)   BMI 41.38 kg/m   Past Medical History:  Diagnosis Date  . Chronic kidney disease   . Depression   . Gout, joint   . Hyperlipidemia, unspecified   . Hypertension   . Osteoarthritis of knee 07/14/2012   right  . Overactive bladder   . Stroke [facility])    Social History   Social History  . Marital status: Divorced    Spouse name: N/A  . Number of children: N/A  . Years of education: N/A   Occupational History  . Not on  file.   Social History Main Topics  . Smoking status: Former Smoker    Packs/day: 1.50    Years: 20.00    Types: Cigarettes    Quit date: 04/23/2002  . Smokeless tobacco: Never Used  . Alcohol use No  . Drug use: No  . Sexual activity: Not on file   Other Topics Concern  . Not on file   Social History Narrative  . No narrative on file   Past Surgical History:  Procedure Laterality Date  . ABDOMINAL HYSTERECTOMY  1987   partial  . MASTECTOMY PARTIAL / LUMPECTOMY     Family History    Problem Relation Age of Onset  . Breast cancer Mother   . Alzheimer's disease Mother   . Hypertension Father   . Kidney disease Father   . Stroke Father   . Stroke Paternal Grandfather   . Diabetes type II Paternal Grandfather   . Diabetes type II Paternal Aunt   . Diabetes type II Daughter    Allergies  Allergen Reactions  . Citalopram     Other reaction(s): Blood Disorder Easy bruising  . Bupropion Anxiety    150 mg.  Has tolerated 75      Assessment & Plan:  Presents as a new patient referred by Dr. Ernest Pine. Patient will be undergoing a total knee replacement and Dr. Ernest Pine would like her optimized. Patient presents with a chief complaint of bilateral lower extremity edema. States this edema has been intermittent for many years. States her edema has progressive the point she will weep fluid from her legs. She denies any open wounds at this time. The patient sleeps sitting in a chair in a dependent position. She is in the process of requiring a hospital bed. Patient with minimal ambulation at this time. Patient states the edema is associated with discomfort. Her edema and discomfort have progressive the point she is unable to function on a daily basis. Patient states she is unable to wear compression stockings as they are too painful. She also has trouble putting them on. She does try to engage and elevation of the best she can. Patient underwent a bilateral ABI which was notable for triphasic tibials bilaterally and no significant lower extremity arterial disease. Bilateral toe brachial indices were normal. Patient denies any fever, nausea or vomiting.  1. Bilateral lower extremity edema - New Patient with chronic edema to the bilateral lower extremity ABI with triphasic tibials with no evidence of arterial disease to the lower extremity Will place the patient in unna boots to control her edema in preparation for a total knee replacement with Dr. Ernest Pine Patient was encouraged to  elevate her legs heart level or higher as much as she can. I will bring her back at her convenience to undergo a venous ultrasound to rule out any reflux that may be contributing to her lower extremity edema Patient to undergo weekly Unna boot changes Patient to follow up in 1 month She expresses her understanding  - VAS [place] LOWER EXTREMITY VENOUS REFLUX; Future  2. Hyperlipidemia, unspecified hyperlipidemia type - Stable Encouraged good control as its slows the progression of atherosclerotic disease  3. Secondary hypertension - Stable Encouraged good control as its slows the progression of atherosclerotic disease  4. Chronic kidney disease, unspecified CKD stage - Stable This can be a major contributor to lower extremity edema Recommend good control of her hypertension as this will worsen her any disease  Current Outpatient Prescriptions on File Prior to Visit  Medication Sig Dispense Refill  . aspirin EC 81 MG tablet Take 81 mg by mouth daily. At 8 am    . colchicine 0.6 MG tablet Take 0.6 mg by mouth 3 (three) times daily as needed.    Marland Kitchen esomeprazole (NEXIUM) 40 MG capsule Take 40 mg by mouth daily at 6 (six) AM.    . fluocinonide cream (LIDEX) 0.05 % Apply 1 application topically 2 times daily as needed to eczema of left hand.    . fluticasone (FLONASE) 50 MCG/ACT nasal spray Place 2 sprays into both nostrils daily. 8 am    . furosemide (LASIX) 20 MG tablet Take 20 mg by mouth every other day. 8 am    . lidocaine (LIDODERM) 5 % Place 1 patch onto the skin daily. At 8 am to right knee for pain. Remove patch at 8 pm. Remove & Discard patch within 12 hours or as directed by MD    . lidocaine (LIDODERM) 5 % Place 1 patch onto the skin every 12 (twelve) hours. Remove & Discard patch within 12 hours or as directed by MD (Patient not taking: Reported on 04/09/2017) 10 patch 0  . lisinopril (PRINIVIL,ZESTRIL) 20 MG tablet Take 20 mg by mouth daily. At 8 am    . magnesium hydroxide (MILK OF  MAGNESIA) 400 MG/5ML suspension Take 30 mLs by mouth See admin instructions. Constipation/no BM for 2 days, every 4 hours prn.    . polyethylene glycol (MIRALAX / GLYCOLAX) packet Take 17 g by mouth daily. Mix one tablespoon with 8oz of your favorite juice or water every day until you are having soft formed stools. Then start taking once daily if you didn't have a stool the day before. 30 each 0  . Polyvinyl Alcohol-Povidone PF (REFRESH) 1.4-0.6 % SOLN Place 2 drops into the right eye 4 (four) times daily. At 8 am, 1 pm, 5 pm, 8 pm    . pravastatin (PRAVACHOL) 40 MG tablet Take 40 mg by mouth at bedtime. 8 pm    . sertraline (ZOLOFT) 25 MG tablet Take 25 mg by mouth daily. 8 am    . traMADol (ULTRAM) 50 MG tablet Take 1 tablet (50 mg total) by mouth every 6 (six) hours as needed. 20 tablet 0  . traZODone (DESYREL) 50 MG tablet Take 50 mg by mouth at bedtime as needed for sleep.     No current facility-administered medications on file prior to visit.    There are no Patient Instructions on file for this visit. No Follow-up on file.  KIMBERLY A STEGMAYER, PA-C

## 2017-04-13 ENCOUNTER — Telehealth (INDEPENDENT_AMBULATORY_CARE_PROVIDER_SITE_OTHER): Payer: Self-pay

## 2017-04-13 NOTE — Telephone Encounter (Signed)
Oley Balm from [facility] home health care called wanting to know if we can give the okay for them to place Unna boots on the patients. I let her know that per the PA she is to have them placed once weekly, coban and Zinc Oxide.

## 2017-04-14 ENCOUNTER — Encounter (INDEPENDENT_AMBULATORY_CARE_PROVIDER_SITE_OTHER): Payer: Self-pay | Admitting: Vascular Surgery

## 2017-04-14 ENCOUNTER — Ambulatory Visit (INDEPENDENT_AMBULATORY_CARE_PROVIDER_SITE_OTHER): Payer: Medicare Other | Admitting: Vascular Surgery

## 2017-04-14 VITALS — Resp 16 | Ht 61.0 in | Wt 217.0 lb

## 2017-04-14 DIAGNOSIS — R600 Localized edema: Secondary | ICD-10-CM

## 2017-04-14 NOTE — Progress Notes (Signed)
History of Present Illness  There is no documented history at this time  Assessments & Plan   There are no diagnoses linked to this encounter.    Additional instructions  Subjective:  Patient presents with venous ulcer of the Bilateral lower extremity.    Procedure:  3 layer unna wrap was placed Bilateral lower extremity.   Plan:   Follow up in one week.  

## 2017-04-21 ENCOUNTER — Encounter (INDEPENDENT_AMBULATORY_CARE_PROVIDER_SITE_OTHER): Payer: Medicare Other

## 2017-04-28 ENCOUNTER — Encounter (INDEPENDENT_AMBULATORY_CARE_PROVIDER_SITE_OTHER): Payer: Medicare Other

## 2017-04-29 ENCOUNTER — Encounter (INDEPENDENT_AMBULATORY_CARE_PROVIDER_SITE_OTHER): Payer: Medicare Other | Admitting: Vascular Surgery

## 2017-04-29 ENCOUNTER — Encounter (INDEPENDENT_AMBULATORY_CARE_PROVIDER_SITE_OTHER): Payer: Medicare Other

## 2017-05-07 ENCOUNTER — Ambulatory Visit (INDEPENDENT_AMBULATORY_CARE_PROVIDER_SITE_OTHER): Payer: Medicare Other | Admitting: Vascular Surgery

## 2017-05-07 ENCOUNTER — Encounter (INDEPENDENT_AMBULATORY_CARE_PROVIDER_SITE_OTHER): Payer: Self-pay | Admitting: Vascular Surgery

## 2017-05-07 ENCOUNTER — Ambulatory Visit (INDEPENDENT_AMBULATORY_CARE_PROVIDER_SITE_OTHER): Payer: Medicare Other

## 2017-05-07 VITALS — BP 137/76 | HR 63 | Resp 16 | Ht 61.0 in | Wt 200.0 lb

## 2017-05-07 DIAGNOSIS — N189 Chronic kidney disease, unspecified: Secondary | ICD-10-CM

## 2017-05-07 DIAGNOSIS — I872 Venous insufficiency (chronic) (peripheral): Secondary | ICD-10-CM

## 2017-05-07 DIAGNOSIS — R600 Localized edema: Secondary | ICD-10-CM | POA: Diagnosis not present

## 2017-05-07 DIAGNOSIS — I890 Lymphedema, not elsewhere classified: Secondary | ICD-10-CM

## 2017-05-07 NOTE — Progress Notes (Signed)
Subjective:    Patient ID: Doris Curry Seen, female    DOB: 25-Dec-1945, 71 y.o.   MRN: [id number] Chief Complaint  Patient presents with  . Follow-up    4 week venous reflux   Patient presents for monthly follow-up. Patient was referred by Dr. Ernest Pine for optimization before a total knee replacement. Over the last month, the patient has not been wearing medical grade one compression stockings or elevating her legs as recommended. She has only undergone two weeks of Unna boot therapy to the bilateral lower extremity to control the edema in her legs. She is refusing to continue unna boot therapy. The patient underwent a bilateral lower extremity venous reflux exam today. The exam was limited due to the patient's unwillingness to lay down on the examination bed for the duplex. The duplex was completed in her wheelchair. Duplex was notable for no evidence of deep or superficial vein thrombosis in the bilateral lower extremities. Venous incompetence noted in the right common femoral vein and saphenous femoral junction of the right lower extremity. Venous incompetence noted in the left saphenous femoral junction of the left lower extremity. The patient is refusing me applying to her insurance for lymphedema pump. ABI conducted during her initial visit: bilateral ABI which was notable for triphasic tibials bilaterally and no significant lower extremity arterial disease. Bilateral toe brachial indices were normal.    Review of Systems  Constitutional: Negative.   HENT: Negative.   Eyes: Negative.   Respiratory: Negative.   Cardiovascular: Positive for leg swelling.  Gastrointestinal: Negative.   Endocrine: Negative.   Genitourinary: Negative.   Musculoskeletal: Negative.   Skin: Negative.   Allergic/Immunologic: Negative.   Neurological: Negative.   Hematological: Negative.   Psychiatric/Behavioral: Negative.       Objective:   Physical Exam  Constitutional: She is oriented to person, place, and  time. She appears well-developed and well-nourished. No distress.  HENT:  Head: Normocephalic and atraumatic.  Eyes: Pupils are equal, round, and reactive to light. Conjunctivae are normal.  Neck: Normal range of motion.  Cardiovascular: Normal rate, regular rhythm, normal heart sounds and intact distal pulses.   Pulses:      Radial pulses are 2+ on the right side, and 2+ on the left side.  Hard to palpate pedal pulses due to body habitus and edema  Pulmonary/Chest: Effort normal.  Musculoskeletal: She exhibits edema (Moderate bilateral lower extremity edema noted).  Neurological: She is alert and oriented to person, place, and time.  Skin: Skin is warm and dry. She is not diaphoretic.  Psychiatric: She has a normal mood and affect. Her behavior is normal. Judgment and thought content normal.  Vitals reviewed.  BP 137/76 (BP Location: Left Arm)   Pulse 63   Resp 16   Ht 5\' 1"  (1.549 m)   Wt 200 lb (90.7 kg)   BMI 37.79 kg/m   Past Medical History:  Diagnosis Date  . Chronic kidney disease   . Depression   . Gout, joint   . Hyperlipidemia, unspecified   . Hypertension   . Osteoarthritis of knee 07/14/2012   right  . Overactive bladder   . Stroke [facility])    Social History   Social History  . Marital status: Divorced    Spouse name: N/A  . Number of children: N/A  . Years of education: N/A   Occupational History  . Not on file.   Social History Main Topics  . Smoking status: Former Smoker    Packs/day: 1.50  Years: 20.00    Types: Cigarettes    Quit date: 04/23/2002  . Smokeless tobacco: Never Used  . Alcohol use No  . Drug use: No  . Sexual activity: Not on file   Other Topics Concern  . Not on file   Social History Narrative  . No narrative on file   Past Surgical History:  Procedure Laterality Date  . ABDOMINAL HYSTERECTOMY  1987   partial  . MASTECTOMY PARTIAL / LUMPECTOMY     Family History  Problem Relation Age of Onset  . Breast cancer  Mother   . Alzheimer's disease Mother   . Hypertension Father   . Kidney disease Father   . Stroke Father   . Stroke Paternal Grandfather   . Diabetes type II Paternal Grandfather   . Diabetes type II Paternal Aunt   . Diabetes type II Daughter    Allergies  Allergen Reactions  . Citalopram     Other reaction(s): Blood Disorder Easy bruising  . Bupropion Anxiety    150 mg.  Has tolerated 75      Assessment & Plan:  Patient presents for monthly follow-up. Patient was referred by Dr. Ernest PineHooten for optimization before a total knee replacement. Over the last month, the patient has not been wearing medical grade one compression stockings or elevating her legs as recommended. She has only undergone two weeks of Unna boot therapy to the bilateral lower extremity to control the edema in her legs. She is refusing to continue unna boot therapy. The patient underwent a bilateral lower extremity venous reflux exam today. The exam was limited due to the patient's unwillingness to lay down on the examination bed for the duplex. The duplex was completed in her wheelchair. Duplex was notable for no evidence of deep or superficial vein thrombosis in the bilateral lower extremities. Venous incompetence noted in the right common femoral vein and saphenous femoral junction of the right lower extremity. Venous incompetence noted in the left saphenous femoral junction of the left lower extremity. The patient is refusing me applying to her insurance for lymphedema pump. ABI conducted during her initial visit: bilateral ABI which was notable for triphasic tibials bilaterally and no significant lower extremity arterial disease. Bilateral toe brachial indices were normal.   1. Chronic venous insufficiency - New Patient with venous reflux to the deep venous system. Unable to offer laser ablation to the deep venous system. Patient was encouraged to wear medical grade one compression stockings, elevate her legs and  remaining active Unfortunately she did not engage in this over the last month The patient underwent two weeks of Unna boot therapy to gain control of her edema. I would like her to continue this however she is refusing. I would like to apply for lymphedema pump however the patient is refusing. At this time, the patient would like to follow up with us after her total knee replacement Patient encouraged to make an appointment at that time.  2. Lymphedema - New As above  3. Chronic kidney disease, unspecified CKD stage - Stabe This is a contributing factor to the patient's lower extremity edema.  Current Outpatient Prescriptions on File Prior to Visit  Medication Sig Dispense Refill  . aspirin EC 81 MG tablet Take 81 mg by mouth daily. At 8 am    . colchicine 0.6 MG tablet Take 0.6 mg by mouth 3 (three) times daily as needed.    Marland Kitchen. esomeprazole (NEXIUM) 40 MG capsule Take 40 mg by mouth daily at  6 (six) AM.    . fluocinonide cream (LIDEX) 0.05 % Apply 1 application topically 2 times daily as needed to eczema of left hand.    . fluticasone (FLONASE) 50 MCG/ACT nasal spray Place 2 sprays into both nostrils daily. 8 am    . furosemide (LASIX) 20 MG tablet Take 20 mg by mouth every other day. 8 am    . lidocaine (LIDODERM) 5 % Place 1 patch onto the skin daily. At 8 am to right knee for pain. Remove patch at 8 pm. Remove & Discard patch within 12 hours or as directed by MD    . lidocaine (LIDODERM) 5 % Place 1 patch onto the skin every 12 (twelve) hours. Remove & Discard patch within 12 hours or as directed by MD 10 patch 0  . lisinopril (PRINIVIL,ZESTRIL) 20 MG tablet Take 20 mg by mouth daily. At 8 am    . magnesium hydroxide (MILK OF MAGNESIA) 400 MG/5ML suspension Take 30 mLs by mouth See admin instructions. Constipation/no BM for 2 days, every 4 hours prn.    . polyethylene glycol (MIRALAX / GLYCOLAX) packet Take 17 g by mouth daily. Mix one tablespoon with 8oz of your favorite juice or water  every day until you are having soft formed stools. Then start taking once daily if you didn't have a stool the day before. 30 each 0  . Polyvinyl Alcohol-Povidone PF (REFRESH) 1.4-0.6 % SOLN Place 2 drops into the right eye 4 (four) times daily. At 8 am, 1 pm, 5 pm, 8 pm    . pravastatin (PRAVACHOL) 40 MG tablet Take 40 mg by mouth at bedtime. 8 pm    . sertraline (ZOLOFT) 25 MG tablet Take 25 mg by mouth daily. 8 am    . traMADol-acetaminophen (ULTRACET) 37.5-325 MG tablet Take by mouth.    . traZODone (DESYREL) 50 MG tablet Take 50 mg by mouth at bedtime as needed for sleep.     No current facility-administered medications on file prior to visit.     There are no Patient Instructions on file for this visit. No Follow-up on file.   Loni Abdon A Trevontae Lindahl, PA-C

## 2017-07-17 ENCOUNTER — Emergency Department: Payer: Medicare Other

## 2017-07-17 ENCOUNTER — Other Ambulatory Visit: Payer: Self-pay

## 2017-07-17 ENCOUNTER — Encounter: Payer: Self-pay | Admitting: Emergency Medicine

## 2017-07-17 ENCOUNTER — Emergency Department
Admission: EM | Admit: 2017-07-17 | Discharge: 2017-07-17 | Disposition: A | Discharge status: Home / Self Care | Payer: Medicare Other | Attending: Emergency Medicine | Admitting: Emergency Medicine

## 2017-07-17 DIAGNOSIS — F321 Major depressive disorder, single episode, moderate: Secondary | ICD-10-CM | POA: Diagnosis not present

## 2017-07-17 DIAGNOSIS — Z87891 Personal history of nicotine dependence: Secondary | ICD-10-CM | POA: Insufficient documentation

## 2017-07-17 DIAGNOSIS — G8929 Other chronic pain: Secondary | ICD-10-CM

## 2017-07-17 DIAGNOSIS — M25561 Pain in right knee: Secondary | ICD-10-CM | POA: Diagnosis not present

## 2017-07-17 DIAGNOSIS — I129 Hypertensive chronic kidney disease with stage 1 through stage 4 chronic kidney disease, or unspecified chronic kidney disease: Secondary | ICD-10-CM | POA: Diagnosis not present

## 2017-07-17 DIAGNOSIS — N189 Chronic kidney disease, unspecified: Secondary | ICD-10-CM | POA: Insufficient documentation

## 2017-07-17 DIAGNOSIS — Z79899 Other long term (current) drug therapy: Secondary | ICD-10-CM | POA: Diagnosis not present

## 2017-07-17 DIAGNOSIS — Z8673 Personal history of transient ischemic attack (TIA), and cerebral infarction without residual deficits: Secondary | ICD-10-CM | POA: Insufficient documentation

## 2017-07-17 DIAGNOSIS — Z7982 Long term (current) use of aspirin: Secondary | ICD-10-CM | POA: Diagnosis not present

## 2017-07-17 DIAGNOSIS — F320 Major depressive disorder, single episode, mild: Secondary | ICD-10-CM | POA: Diagnosis not present

## 2017-07-17 LAB — CBC WITH DIFFERENTIAL/PLATELET
BASOS ABS: 0 10*3/uL (ref 0–0.1)
BASOS PCT: 1 %
EOS ABS: 0.4 10*3/uL (ref 0–0.7)
EOS PCT: 6 %
HCT: 38.9 % (ref 35.0–47.0)
Hemoglobin: 13 g/dL (ref 12.0–16.0)
LYMPHS PCT: 17 %
Lymphs Abs: 1 10*3/uL (ref 1.0–3.6)
MCH: 29.9 pg (ref 26.0–34.0)
MCHC: 33.3 g/dL (ref 32.0–36.0)
MCV: 89.8 fL (ref 80.0–100.0)
MONO ABS: 0.6 10*3/uL (ref 0.2–0.9)
Monocytes Relative: 10 %
Neutro Abs: 3.9 10*3/uL (ref 1.4–6.5)
Neutrophils Relative %: 66 %
PLATELETS: 247 10*3/uL (ref 150–440)
RBC: 4.34 MIL/uL (ref 3.80–5.20)
RDW: 15.6 % — AB (ref 11.5–14.5)
WBC: 5.8 10*3/uL (ref 3.6–11.0)

## 2017-07-17 LAB — COMPREHENSIVE METABOLIC PANEL
ALK PHOS: 123 U/L (ref 38–126)
ALT: 14 U/L (ref 14–54)
AST: 19 U/L (ref 15–41)
Albumin: 3.8 g/dL (ref 3.5–5.0)
Anion gap: 9 (ref 5–15)
BILIRUBIN TOTAL: 0.6 mg/dL (ref 0.3–1.2)
BUN: 21 mg/dL — ABNORMAL HIGH (ref 6–20)
CALCIUM: 9 mg/dL (ref 8.9–10.3)
CO2: 28 mmol/L (ref 22–32)
CREATININE: 1.29 mg/dL — AB (ref 0.44–1.00)
Chloride: 102 mmol/L (ref 101–111)
GFR, EST AFRICAN AMERICAN: 47 mL/min — AB (ref >60)
GFR, EST NON AFRICAN AMERICAN: 40 mL/min — AB (ref >60)
Glucose, Bld: 118 mg/dL — ABNORMAL HIGH (ref 65–99)
Potassium: 4.6 mmol/L (ref 3.5–5.1)
SODIUM: 139 mmol/L (ref 135–145)
TOTAL PROTEIN: 7.5 g/dL (ref 6.5–8.1)

## 2017-07-17 LAB — URINALYSIS, COMPLETE (UACMP) WITH MICROSCOPIC
Bacteria, UA: NONE SEEN
Bilirubin Urine: NEGATIVE
GLUCOSE, UA: NEGATIVE mg/dL
HGB URINE DIPSTICK: NEGATIVE
KETONES UR: NEGATIVE mg/dL
LEUKOCYTES UA: NEGATIVE
Nitrite: NEGATIVE
PH: 5 (ref 5.0–8.0)
Protein, ur: NEGATIVE mg/dL
Specific Gravity, Urine: 1.014 (ref 1.005–1.030)

## 2017-07-17 LAB — BRAIN NATRIURETIC PEPTIDE: B NATRIURETIC PEPTIDE 5: 25 pg/mL (ref 0.0–100.0)

## 2017-07-17 LAB — TROPONIN I

## 2017-07-17 MED ORDER — FLUTICASONE PROPIONATE 50 MCG/ACT NA SUSP
1.0000 | Freq: Every day | NASAL | Status: DC
  Administered 2017-07-17: 1 via NASAL
  Filled 2017-07-17: qty 16

## 2017-07-17 MED ORDER — DULOXETINE HCL 30 MG PO CPEP: 30.0000 mg | ORAL_CAPSULE | Freq: Two times a day (BID) | ORAL | 2 refills | Status: AC

## 2017-07-17 MED ORDER — MELOXICAM 7.5 MG PO TABS: 7.5000 mg | ORAL_TABLET | Freq: Every day | ORAL | 2 refills | Status: DC

## 2017-07-17 NOTE — ED Provider Notes (Signed)
[facility] Emergency Department Provider Note   ____________________________________________   First MD Initiated Contact with Patient 07/17/17 1055     (approximate)  I have reviewed the triage vital signs and the nursing notes.   HISTORY  Chief Complaint Knee Pain   HPI Doris Curry is a 72 y.o. female had a stroke in 2003 with resulting right-sided weakness. She fell several weeks ago and hit her knee and now complains of bad knee pain. She's had knee pain previously and had Synvisc injections etc. but now the knee pain is so bad she really does not walk much and spends most of her time in the recliner and doesn't even get up to urinate. She reports she is developing bedsore on her buttocks. She also has some redness on her shins bilaterally.she has a walker but is unable to do much with that because of the pain in the weakness.   Past Medical History:  Diagnosis Date  . Chronic kidney disease   . Depression   . Gout, joint   . Hyperlipidemia, unspecified   . Hypertension   . Osteoarthritis of knee 07/14/2012   right  . Overactive bladder   . Stroke [facility](HCC)     Patient Active Problem List   Diagnosis Date Noted  . Chronic venous insufficiency 05/07/2017  . Lymphedema 05/07/2017  . Hypertension   . Chronic kidney disease   . Hyperlipidemia, unspecified   . Stroke [facility](HCC)     Past Surgical History:  Procedure Laterality Date  . ABDOMINAL HYSTERECTOMY  1987   partial  . MASTECTOMY PARTIAL / LUMPECTOMY      Prior to Admission medications   Medication Sig Start Date End Date Taking? Authorizing Provider  aspirin EC 81 MG tablet Take 81 mg by mouth daily. At 8 am    [provider]  colchicine 0.6 MG tablet Take 0.6 mg by mouth 3 (three) times daily as needed.    [provider]  DULoxetine (CYMBALTA) 30 MG capsule Take 1 capsule (30 mg total) by mouth 2 (two) times daily. 07/17/17 10/15/17  Clapacs, Jackquline DenmarkJohn T, MD  esomeprazole  (NEXIUM) 40 MG capsule Take 40 mg by mouth daily at 6 (six) AM.    [provider]  fluocinonide cream (LIDEX) 0.05 % Apply 1 application topically 2 times daily as needed to eczema of left hand.    [provider]  fluticasone (FLONASE) 50 MCG/ACT nasal spray Place 2 sprays into both nostrils daily. 8 am    [provider]  furosemide (LASIX) 20 MG tablet Take 20 mg by mouth every other day. 8 am    [provider]  lidocaine (LIDODERM) 5 % Place 1 patch onto the skin daily. At 8 am to right knee for pain. Remove patch at 8 pm. Remove & Discard patch within 12 hours or as directed by MD    [provider]  lidocaine (LIDODERM) 5 % Place 1 patch onto the skin every 12 (twelve) hours. Remove & Discard patch within 12 hours or as directed by MD 02/26/17 02/26/18  Willy Eddyobinson, Patrick, MD  lisinopril (PRINIVIL,ZESTRIL) 20 MG tablet Take 20 mg by mouth daily. At 8 am    [provider]  magnesium hydroxide (MILK OF MAGNESIA) 400 MG/5ML suspension Take 30 mLs by mouth See admin instructions. Constipation/no BM for 2 days, every 4 hours prn.    [provider]  polyethylene glycol (MIRALAX / GLYCOLAX) packet Take 17 g by mouth daily. Mix  one tablespoon with 8oz of your favorite juice or water every day until you are having soft formed stools. Then start taking once daily if you didn't have a stool the day before. 02/26/17   Willy Eddy, MD  Polyvinyl Alcohol-Povidone PF (REFRESH) 1.4-0.6 % SOLN Place 2 drops into the right eye 4 (four) times daily. At 8 am, 1 pm, 5 pm, 8 pm    [provider]  pravastatin (PRAVACHOL) 40 MG tablet Take 40 mg by mouth at bedtime. 8 pm    [provider]  traZODone (DESYREL) 50 MG tablet Take 50 mg by mouth at bedtime as needed for sleep.    [provider]    Allergies Citalopram and Bupropion  Family History  Problem Relation Age of Onset  . Breast cancer Mother   . Alzheimer's  disease Mother   . Hypertension Father   . Kidney disease Father   . Stroke Father   . Stroke Paternal Grandfather   . Diabetes type II Paternal Grandfather   . Diabetes type II Paternal Aunt   . Diabetes type II Daughter     Social History Social History   Tobacco Use  . Smoking status: Former Smoker    Packs/day: 1.50    Years: 20.00    Pack years: 30.00    Types: Cigarettes    Last attempt to quit: 04/23/2002    Years since quitting: 15.2  . Smokeless tobacco: Never Used  Substance Use Topics  . Alcohol use: No  . Drug use: No    Review of Systems  Constitutional: No fever/chills Eyes: No visual changes. ENT: No sore throat. Cardiovascular: Denies chest pain. Respiratory: Denies shortness of breath. Gastrointestinal: No abdominal pain.  No nausea, no vomiting.  No diarrhea.  No constipation. Genitourinary: Negative for dysuria. Musculoskeletal: Negative for back pain. Skin: Negative for rash. Neurological: Negative for headaches,new  focal weakness   ____________________________________________   PHYSICAL EXAM:  VITAL SIGNS: ED Triage Vitals  Enc Vitals Group     BP 07/17/17 1045 (!) 159/82     Pulse Rate 07/17/17 1046 83     Resp 07/17/17 1045 18     Temp 07/17/17 1045 (!) 97.5 F (36.4 C)     Temp Source 07/17/17 1045 Oral     SpO2 07/17/17 1045 97 %     Weight 07/17/17 1044 220 lb (99.8 kg)     Height 07/17/17 1044 5\' 1"  (1.549 m)     Head Circumference --      Peak Flow --      Pain Score 07/17/17 1043 10     Pain Loc --      Pain Edu? --      Excl. in GC? --     Constitutional: Alert and oriented. Well appearing and in no acute distress. Eyes: Conjunctivae are normal.  Head: Atraumatic. Nose: patient reports chronic nasal congestion so bad she can't lay flat because she can't breathe. She uses Flonase once a day and salt water nasal spray. She says she can't sleep in bed and has to sleep in a recliner because of this. Mouth/Throat: Mucous  membranes are moist.  Oropharynx non-erythematous. Neck: No stridor.  Cardiovascular: Normal rate, regular rhythm. Grossly normal heart sounds.  Good peripheral circulation. Respiratory: Normal respiratory effort.  No retractions. Lungs CTAB. Gastrointestinal: Soft and nontender. No distention. No abdominal bruits. No CVA tenderness. Musculoskeletal: No lower extremity tenderness bilateral edema.  No apparent joint effusions.right knee is tender to move  although it seems to be stable. It is not red does not seem to be more swollen than the other knee. Neurologic:  Normal speech and language. No new gross focal neurologic deficits are appreciated.she has old right sided weakness from the stroke Skin:  Skin is warm, dry and intact. she has bilateral erythema in the shins. Right is greater than left Psychiatric: Mood and affect are normal. Speech and behavior are normal.  ____________________________________________   LABS (all labs ordered are listed, but only abnormal results are displayed)  ____________________________________________  EKG   ____________________________________________  RADIOLOGY chest x-ray is read as no acute disease  Knee x-ray shows degenerative joint disease severe  ____________________________________________   PROCEDURES  Procedure(s) performed:   Procedures  Critical Care performed:   ____________________________________________   INITIAL IMPRESSION / ASSESSMENT AND PLAN / ED COURSE  . Patient has had physical therapy at home and discontinued herself patient and was in rehabilitation and left early. Patient has degenerative joint disease but is been told she needs to bring down the swelling in her legs probably lose weight before they will do surgery which she has been noncompliant. Psychiatry has seen the patient feels she is depressed and on inadequate doses of Zoloft changed medicine but she has not inpatient psychiatric care she has some venous  stasis changes and a little bit of erythema on the anterior parts of her shin but there is no pain there are no warmth not swollen locally it does not look like cellulitis she does not have fever or white count either she has a scratch on her buttocks skin but no sign of a decubitus ulcer forming at this point. Patient does not meet any criteria for inpatient treatment. She cannot afford to pay for inpatient rehabilitation herself. I will let her go with the medications prescribed by a psychiatrist advised her that she should have her regular physician attempt home physical therapy again. I will try some tramadol for the knee pain. She can also use Tylenol.      ____________________________________________   FINAL CLINICAL IMPRESSION(S) / ED DIAGNOSES  Final diagnoses:  Chronic pain of right knee  Current mild episode of major depressive disorder, unspecified whether recurrent [facility])     ED Discharge Orders        Ordered    DULoxetine (CYMBALTA) 30 MG capsule  2 times daily     07/17/17 1447    meloxicam (MOBIC) 7.5 MG tablet  Daily,   Status:  Discontinued     07/17/17 1511       Note:  This document was prepared using Dragon voice recognition software and may include unintentional dictation errors.    Arnaldo Natal, MD 07/17/17 [phone]

## 2017-07-17 NOTE — ED Notes (Signed)
ED Provider at bedside. 

## 2017-07-17 NOTE — ED Notes (Signed)
Informed Dr.Clapacs of Consult ordered @ 1318/ Paulette RN aware

## 2017-07-17 NOTE — Care Management Note (Signed)
Case Management Note  Patient Details  Name: Albesa SeenFrances Carrero MRN: [id number] Date of Birth: 1946/07/06  Subjective/Objective:            Spoke to patient and obtained permission to speak in front of her daughter at bedside. The MD [facility]. It turns out this lady says she cannot comply because the pain is too much , so she has stopped walking. She is in possession of a hospital bed her daughters provided, but she does not use it because she feels the mattress is too soft. The daughter relates that DR Theadore NanSatter wrote a letter for the family to send to Medicare about the debilitation of the patient, and they were told the patient would have to pay out of pocket.     They say she cannot afford the 10K they were quoted. The patient was on Medicaid previously, but no longer qualified for that when her husband died and she now gets his benefits. I explained to them that PT is what I was hoping to set up, and she states Dr [facility]. The patient describes a catch 22 situation where she needs knee surgery, but cannot get it until the swelling comes down in her knee.She sleeps in a chair that does not elevate her leg high enough to achieve this.  The daughter is yelling and I have asked her to lower her voice. She complied and continues to state that the system is "crap, and useless". She also does state that her mother is clearly incompetent to be so stubborn and contrary to what is good for her.I have relayed to DR Juliette Alcidemelinda that the daughter wants a behavoral consult.   Action/Plan:   Expected Discharge Date:                  Expected Discharge Plan:     In-House Referral:     Discharge planning Services     Post Acute Care Choice:    Choice offered to:     DME Arranged:    DME Agency:     HH Arranged:    HH Agency:     Status of Service:     If discussed at [employer] Length of Stay Meetings, dates discussed:    Additional  Comments:  Berna BueCheryl Lillion Elbert, RN 07/17/2017, 12:44 PM

## 2017-07-17 NOTE — BH Assessment (Signed)
Assessment Note  Doris Curry is an 72 y.o. female who arrived to the ED at the insistence of her daughters due to her unwillingness to care for herself, including, but not limited to, using the bathroom and doing physical therapy to increase the strength in her knee.  Pt currently lives with one of her daughters. She states she has four daughters and that she has been living with different daughters for several years. She appears to be very proud of her children, as she smiles when she talks about them. Pt states her daughters are her main supports.  Pt denies substance abuse. She shares she has no history of self-harm nor of HI. She admits that, approximately two years ago, she did have suicidal thoughts when she began having knee pain, though she stated she didn't have a plan and that she would never follow through with the act of suicide. Pt states her husband committed suicide in 2016 and that it has been very hard on her and her daughters.  Pt denies any prior sexual, physical, or emotional abuse. She denies having any hallucinations or delusions. Pt denies prior therapy or psychiatry services nor any prior inpatient services. She denies any pending legal charges or upcoming court dates.   Diagnosis: Depression  Past Medical History:  Past Medical History:  Diagnosis Date  . Chronic kidney disease   . Depression   . Gout, joint   . Hyperlipidemia, unspecified   . Hypertension   . Osteoarthritis of knee 07/14/2012   right  . Overactive bladder   . Stroke [facility])     Past Surgical History:  Procedure Laterality Date  . ABDOMINAL HYSTERECTOMY  1987   partial  . MASTECTOMY PARTIAL / LUMPECTOMY      Family History:  Family History  Problem Relation Age of Onset  . Breast cancer Mother   . Alzheimer's disease Mother   . Hypertension Father   . Kidney disease Father   . Stroke Father   . Stroke Paternal Grandfather   . Diabetes type II Paternal Grandfather   . Diabetes type  II Paternal Aunt   . Diabetes type II Daughter     Social History:  reports that she quit smoking about 15 years ago. Her smoking use included cigarettes. She has a 30.00 pack-year smoking history. she has never used smokeless tobacco. She reports that she does not drink alcohol or use drugs.  Additional Social History:  Alcohol / Drug Use Pain Medications: None reported Prescriptions: None reported Over the Counter: None reported History of alcohol / drug use?: No history of alcohol / drug abuse Longest period of sobriety (when/how long): N/A  CIWA: CIWA-Ar BP: (!) 144/86 Pulse Rate: 78 COWS:    Allergies:  Allergies  Allergen Reactions  . Citalopram     Other reaction(s): Blood Disorder Easy bruising  . Bupropion Anxiety    150 mg.  Has tolerated 75    Home Medications:  (Not in a hospital admission)  OB/GYN Status:  No LMP recorded. Patient has had a hysterectomy.  General Assessment Data Location of Assessment: [facility] ED TTS Assessment: In system Is this a Tele or Face-to-Face Assessment?: Face-to-Face Is this an Initial Assessment or a Re-assessment for this encounter?: Initial Assessment Marital status: Widowed Juanell Fairly name: Willa Rough Is patient pregnant?: No Pregnancy Status: No Living Arrangements: Children Can pt return to current living arrangement?: Yes Admission Status: Voluntary Is patient capable of signing voluntary admission?: Yes Referral Source: Self/Family/Friend Insurance type: Medicare  Medical  Screening Exam [facility] Walk-in ONLY) Medical Exam completed: Yes  Crisis Care Plan Living Arrangements: Children Legal Guardian: Other:(N/A) Name of Psychiatrist: N/A Name of Therapist: N/A  Education Status Is patient currently in school?: No Current Grade: N/A Highest grade of school patient has completed: College Name of school: N/A Contact person: N/A  Risk to self with the past 6 months Suicidal Ideation: No(Pt thought about suicide 2 years ago  due to pain) Has patient been a risk to self within the past 6 months prior to admission? : No Suicidal Intent: No Has patient had any suicidal intent within the past 6 months prior to admission? : No Is patient at risk for suicide?: No Suicidal Plan?: No Has patient had any suicidal plan within the past 6 months prior to admission? : No Access to Means: No What has been your use of drugs/alcohol within the last 12 months?: N/A Previous Attempts/Gestures: No How many times?: 0 Other Self Harm Risks: N/A Triggers for Past Attempts: Other (Comment)(Pain) Intentional Self Injurious Behavior: None Family Suicide History: Yes(Pt's husband committed suicide in 2016) Recent stressful life event(s): Other (Comment)(Pt is in pain) Persecutory voices/beliefs?: No Depression: No Depression Symptoms: (None reported) Substance abuse history and/or treatment for substance abuse?: No Suicide prevention information given to non-admitted patients: Not applicable  Risk to Others within the past 6 months Homicidal Ideation: No Does patient have any lifetime risk of violence toward others beyond the six months prior to admission? : No Thoughts of Harm to Others: No Current Homicidal Intent: No Current Homicidal Plan: No Access to Homicidal Means: No Identified Victim: N/A History of harm to others?: No Assessment of Violence: On admission Violent Behavior Description: N/A Does patient have access to weapons?: No Criminal Charges Pending?: No Does patient have a court date: No Is patient on probation?: No  Psychosis Hallucinations: None noted Delusions: None noted  Mental Status Report Appearance/Hygiene: Disheveled, In scrubs Eye Contact: Good Motor Activity: Unable to assess Speech: Soft, Slow, Unremarkable Level of Consciousness: Alert Mood: Sullen, Pleasant Affect: Appropriate to circumstance, Sullen Anxiety Level: Minimal Thought Processes: Coherent, Relevant Judgement:  Partial Orientation: Place, Person, Time, Situation Obsessive Compulsive Thoughts/Behaviors: None  Cognitive Functioning Concentration: Normal Memory: Recent Intact, Remote Intact IQ: Average Insight: Fair Impulse Control: Good Appetite: Good Sleep: No Change Vegetative Symptoms: None  ADLScreening [facility] Assessment Services) Patient's cognitive ability adequate to safely complete daily activities?: No  Prior Inpatient Therapy Prior Inpatient Therapy: No  Prior Outpatient Therapy Prior Outpatient Therapy: No Does patient have an ACCT team?: No Does patient have Intensive In-House Services?  : No Does patient have [facility] services? : No Does patient have P4CC services?: No  ADL Screening (condition at time of admission) Patient's cognitive ability adequate to safely complete daily activities?: No Is the patient deaf or have difficulty hearing?: No Does the patient have difficulty seeing, even when wearing glasses/contacts?: No Does the patient have difficulty concentrating, remembering, or making decisions?: No Does the patient have difficulty dressing or bathing?: Yes Communication: Independent Dressing (OT): Needs assistance Is this a change from baseline?: Pre-admission baseline Grooming: Independent Feeding: Independent Bathing: Needs assistance Is this a change from baseline?: Pre-admission baseline Toileting: Needs assistance Is this a change from baseline?: Pre-admission baseline In/Out Bed: Needs assistance Is this a change from baseline?: Pre-admission baseline Walks in Home: Needs assistance Is this a change from baseline?: Pre-admission baseline Does the patient have difficulty walking or climbing stairs?: Yes Weakness of Legs: Both Weakness of Arms/Hands: None  Home Assistive Devices/Equipment Home Assistive Devices/Equipment: Wheelchair  Therapy Consults (therapy consults require a physician order) PT Evaluation Needed: Yes (Comment) OT Evalulation  Needed: No SLP Evaluation Needed: No   Values / Beliefs Cultural Requests During Hospitalization: None Spiritual Requests During Hospitalization: None Consults Spiritual Care Consult Needed: No Social Work Consult Needed: No Merchant navy officerAdvance Directives (For Healthcare) Does Patient Have a Medical Advance Directive?: No Would patient like information on creating a medical advance directive?: No - Patient declined    Additional Information 1:1 In Past 12 Months?: No CIRT Risk: No Elopement Risk: No Does patient have medical clearance?: Yes     Disposition:  Disposition Initial Assessment Completed for this Encounter: Yes Disposition of Patient: Discharge with Outpatient Resources  On Site Evaluation by:   Reviewed with Physician:    Ralph DowdySamantha L Markasia Carrol 07/17/2017 4:10 PM

## 2017-07-17 NOTE — ED Triage Notes (Signed)
Pt reports chronic right knee pain, states the pain is so bad it is difficult to walk. States the pain is worse today.   Pt has been taking Tramadol at home. Last dose was at 0830 and is take every 8 hours.

## 2017-07-17 NOTE — Consult Note (Signed)
[place] [facility] Consult   Reason for Consult: Consult for 72 year old woman with no specific past psychiatric history who is in the hospital because of chronic pain and disability.  Concern about depression and decision making Referring Physician: Rip Harbour Patient Identification: Doris Curry MRN:  [id number] Principal Diagnosis: Moderate major depression, single episode [facility]) Diagnosis:   Patient Active Problem List   Diagnosis Date Noted  . Moderate major depression, single episode ([place]) [F32.1] 07/17/2017    Priority: High  . Chronic pain [G89.29] 07/17/2017    Priority: Medium  . Chronic venous insufficiency [I87.2] 05/07/2017  . Lymphedema [I89.0] 05/07/2017  . Hypertension [I10]   . Chronic kidney disease [N18.9]   . Hyperlipidemia, unspecified [E78.5]   . Stroke [facility]) [I63.9]     Total Time spent with patient: 1 hour  Subjective:   Doris Curry is a 72 y.o. female patient admitted with "I just feel weak".  HPI: Patient interviewed chart reviewed.  Reviewed old hospital notes spoke with the emergency room physician.  This is a 72 year old woman whose family brought her into the hospital because of her progressive weakness chronic pain failure to make any progress with her physical condition.  They requested a psychiatric evaluation.  Patient presented as cooperative and pleasant and forthcoming.  Her chief complaint is chronic pain in her right knee.  Right knee is swollen and in pain and this is her primary reason for not being able to get out of her chair or move around or take care of herself.  She has talked about getting a knee replacement with an orthopedic surgeon but been told it cannot be done until the swelling goes down.  Patient is supposed to be doing more physical therapy and following a low sodium diet and taking other measures to reduce the swelling but has made no progress.  Instead she has become progressively more physically withdrawn.  She stays in a  reclining chair almost 24 hours a day.  Only gets up to go to the bathroom once a day.  Otherwise sits in urine and diaper.  She is developing skin ulcers as a result.  Patient describes her mood as depressed.  She has been feeling persistently depressed and hopeless for about a month and a half at least.  Sleep patterns are irregular.  Sleeps irregularly at night and also naps during the day.  Appetite stable.  She knows she is supposed to be following a low-sodium diet and has not been doing it and has little excuse for it.  Patient feels hopeless at times.  She has had suicidal thoughts and wishes that she were dead but no thought of acting on it and no intention or plan to try to kill herself.  Denies any psychotic symptoms.  Has lost enjoyment in most activities.  Patient says that she thinks the only way she will get better is if she has someone with her every day assisting her doing physical therapy.  She does not feel like she can do the physical therapy on her own.  She is currently being prescribed Zoloft 25 mg a day.  Not seeing any mental health provider.  Social history: Patient lives with 1 of her 4 daughters.  Over the last 2-3 years she has been staying sequentially with one or another of her daughters.  She is a widower her ex-husband having killed himself sometime ago.  She expresses positive feelings towards her family.  It sounds like they have become frustrated with her behavior  however.  Medical history: Patient is overweight has hypertension has a past history of stroke.  Chronic kidney disease hyperlipidemia.  Chronic venous insufficiency.  Possibly now having pressure ulcers.  Patient has chronic pain in her right knee.  Substance abuse history: No history reported of drug or alcohol abuse  Past Psychiatric History: No history of seeing a psychiatrist therapist or mental health provider.  She is currently prescribed 25 mg of Zoloft per day.  Her allergies are listed as including  citalopram and bupropion which suggests other antidepressants have been tried.  No history of suicide attempts no history of inpatient treatment.  Risk to Self: Is patient at risk for suicide?: No Risk to Others:   Prior Inpatient Therapy:   Prior Outpatient Therapy:    Past Medical History:  Past Medical History:  Diagnosis Date  . Chronic kidney disease   . Depression   . Gout, joint   . Hyperlipidemia, unspecified   . Hypertension   . Osteoarthritis of knee 07/14/2012   right  . Overactive bladder   . Stroke [facility])     Past Surgical History:  Procedure Laterality Date  . ABDOMINAL HYSTERECTOMY  1987   partial  . MASTECTOMY PARTIAL / LUMPECTOMY     Family History:  Family History  Problem Relation Age of Onset  . Breast cancer Mother   . Alzheimer's disease Mother   . Hypertension Father   . Kidney disease Father   . Stroke Father   . Stroke Paternal Grandfather   . Diabetes type II Paternal Grandfather   . Diabetes type II Paternal Aunt   . Diabetes type II Daughter    Family Psychiatric  History: Does not know of any Social History:  Social History   Substance and Sexual Activity  Alcohol Use No     Social History   Substance and Sexual Activity  Drug Use No    Social History   Socioeconomic History  . Marital status: Divorced    Spouse name: None  . Number of children: None  . Years of education: None  . Highest education level: None  Social Needs  . Financial resource strain: None  . Food insecurity - worry: None  . Food insecurity - inability: None  . Transportation needs - medical: None  . Transportation needs - non-medical: None  Occupational History  . None  Tobacco Use  . Smoking status: Former Smoker    Packs/day: 1.50    Years: 20.00    Pack years: 30.00    Types: Cigarettes    Last attempt to quit: 04/23/2002    Years since quitting: 15.2  . Smokeless tobacco: Never Used  Substance and Sexual Activity  . Alcohol use: No  .  Drug use: No  . Sexual activity: None  Other Topics Concern  . None  Social History Narrative  . None   Additional Social History:    Allergies:   Allergies  Allergen Reactions  . Citalopram     Other reaction(s): Blood Disorder Easy bruising  . Bupropion Anxiety    150 mg.  Has tolerated 75    Labs:  Results for orders placed or performed during the hospital encounter of 07/17/17 (from the past 48 hour(s))  Comprehensive metabolic panel     Status: Abnormal   Collection Time: 07/17/17 12:33 PM  Result Value Ref Range   Sodium 139 135 - 145 mmol/L   Potassium 4.6 3.5 - 5.1 mmol/L   Chloride 102 101 - 111  mmol/L   CO2 28 22 - 32 mmol/L   Glucose, Bld 118 (H) 65 - 99 mg/dL   BUN 21 (H) 6 - 20 mg/dL   Creatinine, Ser 1.29 (H) 0.44 - 1.00 mg/dL   Calcium 9.0 8.9 - 10.3 mg/dL   Total Protein 7.5 6.5 - 8.1 g/dL   Albumin 3.8 3.5 - 5.0 g/dL   AST 19 15 - 41 U/L   ALT 14 14 - 54 U/L   Alkaline Phosphatase 123 38 - 126 U/L   Total Bilirubin 0.6 0.3 - 1.2 mg/dL   GFR calc non Af Amer 40 (L) >60 mL/min   GFR calc Af Amer 47 (L) >60 mL/min    Comment: (NOTE) The eGFR has been calculated using the CKD EPI equation. This calculation has not been validated in all clinical situations. eGFR's persistently <60 mL/min signify possible Chronic Kidney Disease.    Anion gap 9 5 - 15    Comment: Performed at [facility], [address] [place], [place] [address]  Brain natriuretic peptide     Status: None   Collection Time: 07/17/17 12:33 PM  Result Value Ref Range   B Natriuretic Peptide 25.0 0.0 - 100.0 pg/mL    Comment: Performed at [facility], [address] [place], [place] [address]  Troponin I     Status: None   Collection Time: 07/17/17 12:33 PM  Result Value Ref Range   Troponin I <0.03 <0.03 ng/mL    Comment: Performed at [facility], [address] [place], [place] [address]  CBC with Differential     Status: Abnormal   Collection  Time: 07/17/17 12:33 PM  Result Value Ref Range   WBC 5.8 3.6 - 11.0 K/uL   RBC 4.34 3.80 - 5.20 MIL/uL   Hemoglobin 13.0 12.0 - 16.0 g/dL   HCT 38.9 35.0 - 47.0 %   MCV 89.8 80.0 - 100.0 fL   MCH 29.9 26.0 - 34.0 pg   MCHC 33.3 32.0 - 36.0 g/dL   RDW 15.6 (H) 11.5 - 14.5 %   Platelets 247 150 - 440 K/uL   Neutrophils Relative % 66 %   Neutro Abs 3.9 1.4 - 6.5 K/uL   Lymphocytes Relative 17 %   Lymphs Abs 1.0 1.0 - 3.6 K/uL   Monocytes Relative 10 %   Monocytes Absolute 0.6 0.2 - 0.9 K/uL   Eosinophils Relative 6 %   Eosinophils Absolute 0.4 0 - 0.7 K/uL   Basophils Relative 1 %   Basophils Absolute 0.0 0 - 0.1 K/uL    Comment: Performed at [facility], [address] [place], [place] [address]  Urinalysis, Complete w Microscopic     Status: Abnormal   Collection Time: 07/17/17 12:33 PM  Result Value Ref Range   Color, Urine YELLOW (A) YELLOW   APPearance CLEAR (A) CLEAR   Specific Gravity, Urine 1.014 1.005 - 1.030   pH 5.0 5.0 - 8.0   Glucose, UA NEGATIVE NEGATIVE mg/dL   Hgb urine dipstick NEGATIVE NEGATIVE   Bilirubin Urine NEGATIVE NEGATIVE   Ketones, ur NEGATIVE NEGATIVE mg/dL   Protein, ur NEGATIVE NEGATIVE mg/dL   Nitrite NEGATIVE NEGATIVE   Leukocytes, UA NEGATIVE NEGATIVE   RBC / HPF 0-5 0 - 5 RBC/hpf   WBC, UA 0-5 0 - 5 WBC/hpf   Bacteria, UA NONE SEEN NONE SEEN   Squamous Epithelial / LPF 0-5 (A) NONE SEEN    Comment: Performed at [facility],  [place], [place] [address]    Current Facility-Administered Medications  Medication Dose Route Frequency Provider Last Rate Last Dose  . fluticasone (FLONASE) 50 MCG/ACT nasal spray 1 spray  1 spray Each Nare Daily Nena Polio, MD   1 spray at 07/17/17 1416   Current Outpatient Medications  Medication Sig Dispense Refill  . aspirin EC 81 MG tablet Take 81 mg by mouth daily. At 8 am    . colchicine 0.6 MG tablet Take 0.6 mg by mouth 3 (three) times daily as needed.    .  DULoxetine (CYMBALTA) 30 MG capsule Take 1 capsule (30 mg total) by mouth 2 (two) times daily. 60 capsule 2  . esomeprazole (NEXIUM) 40 MG capsule Take 40 mg by mouth daily at 6 (six) AM.    . fluocinonide cream (LIDEX) 8.83 % Apply 1 application topically 2 times daily as needed to eczema of left hand.    . fluticasone (FLONASE) 50 MCG/ACT nasal spray Place 2 sprays into both nostrils daily. 8 am    . furosemide (LASIX) 20 MG tablet Take 20 mg by mouth every other day. 8 am    . lidocaine (LIDODERM) 5 % Place 1 patch onto the skin daily. At 8 am to right knee for pain. Remove patch at 8 pm. Remove & Discard patch within 12 hours or as directed by MD    . lidocaine (LIDODERM) 5 % Place 1 patch onto the skin every 12 (twelve) hours. Remove & Discard patch within 12 hours or as directed by MD 10 patch 0  . lisinopril (PRINIVIL,ZESTRIL) 20 MG tablet Take 20 mg by mouth daily. At 8 am    . magnesium hydroxide (MILK OF MAGNESIA) 400 MG/5ML suspension Take 30 mLs by mouth See admin instructions. Constipation/no BM for 2 days, every 4 hours prn.    . polyethylene glycol (MIRALAX / GLYCOLAX) packet Take 17 g by mouth daily. Mix one tablespoon with 8oz of your favorite juice or water every day until you are having soft formed stools. Then start taking once daily if you didn't have a stool the day before. 30 each 0  . Polyvinyl Alcohol-Povidone PF (REFRESH) 1.4-0.6 % SOLN Place 2 drops into the right eye 4 (four) times daily. At 8 am, 1 pm, 5 pm, 8 pm    . pravastatin (PRAVACHOL) 40 MG tablet Take 40 mg by mouth at bedtime. 8 pm    . traZODone (DESYREL) 50 MG tablet Take 50 mg by mouth at bedtime as needed for sleep.      Musculoskeletal: Strength & Muscle Tone: decreased Gait & Station: unable to stand Patient leans: N/A  Psychiatric Specialty Exam: Physical Exam  Nursing note and vitals reviewed. Constitutional: She appears well-developed and well-nourished.  HENT:  Head: Normocephalic and  atraumatic.  Eyes: Conjunctivae are normal. Pupils are equal, round, and reactive to light.  Neck: Normal range of motion.  Cardiovascular: Regular rhythm and normal heart sounds.  Respiratory: Effort normal. No respiratory distress.  GI: Soft.  Musculoskeletal: Normal range of motion.       Legs: Neurological: She is alert.  Skin: Skin is warm and dry.  Psychiatric: Her speech is normal and behavior is normal. Cognition and memory are normal. She expresses impulsivity. She exhibits a depressed mood. She expresses suicidal ideation. She expresses no suicidal plans.    Review of Systems  HENT: Negative.   Eyes: Negative.   Respiratory: Negative.   Cardiovascular: Positive for leg swelling.  Gastrointestinal: Negative.   Musculoskeletal: Positive for joint pain.  Skin: Negative.   Neurological: Positive for weakness.  Psychiatric/Behavioral: Positive for depression and suicidal ideas. Negative for hallucinations, memory loss and substance abuse. The patient is nervous/anxious and has insomnia.     Blood pressure (!) 144/86, pulse 78, temperature (!) 97.5 F (36.4 C), temperature source Oral, resp. rate 18, height 5' 1"  (1.549 m), weight 99.8 kg (220 lb), SpO2 99 %.Body mass index is 41.57 kg/m.  General Appearance: Casual  Eye Contact:  Good  Speech:  Normal Rate  Volume:  Normal  Mood:  Depressed and Dysphoric  Affect:  Congruent  Thought Process:  Goal Directed  Orientation:  Full (Time, Place, and Person)  Thought Content:  Logical  Suicidal Thoughts:  Yes.  without intent/plan  Homicidal Thoughts:  No  Memory:  Immediate;   Good Recent;   Fair Remote;   Fair  Judgement:  Impaired  Insight:  Shallow  Psychomotor Activity:  Decreased  Concentration:  Concentration: Fair  Recall:  [employer] of Knowledge:  Fair  Language:  Fair  Akathisia:  No  Handed:  Right  AIMS (if indicated):     Assets:  Communication Skills Desire for Improvement Housing Social Support    ADL's:  Impaired  Cognition:  WNL  Sleep:        Treatment Plan Summary: Medication management and Plan 72 year old woman with multiple symptoms of depression including suicidal thoughts but no intention or plan to act on it.  No evidence of psychosis.  No evidence of clinically significant dementia.  Patient understands her medical condition and the medical recommendations well enough to have capacity to make decisions.  She was able to comprehend the idea that if she continues in her current state she is setting herself up for fatal complications.  Patient appears to have capacity to make decisions at this point.  Her passive that he and lack of motivation appear to be related at least in part to depression which is being inadequately treated.  Patient however does not meet criteria nor need inpatient hospital treatment for depression.  Education completed.  Recommend changing medication by discontinuing Zoloft and starting Cymbalta 60 mg a day divided.  Education about the need to have this followed up to make sure it is working and get adequate improvement.  Tried to coach her about the need to make incremental improvement in her activity.  Strongly encouraged her to accept the offer of i in-home physical therapy.  Case reviewed with emergency room physician.  No need for IVC.  New prescription written  Disposition: No evidence of imminent risk to self or others at present.   Patient does not meet criteria for psychiatric inpatient admission. Supportive therapy provided about ongoing stressors.  Alethia Berthold, MD 07/17/2017 3:36 PM

## 2017-07-17 NOTE — ED Notes (Signed)
Social work at bedside.  

## 2017-07-17 NOTE — Care Management Note (Signed)
Case Management Note  Patient Details  Name: Doris Curry MRN: [id number] Date of Birth: 01-14-1946  Subjective/Objective:      Patient and daughter [facility] the patient.              Action/Plan:   Expected Discharge Date:                  Expected Discharge Plan:     In-House Referral:     Discharge planning Services     Post Acute Care Choice:    Choice offered to:     DME Arranged:    DME Agency:     HH Arranged:    HH Agency:     Status of Service:     If discussed at [employer] Length of Stay Meetings, dates discussed:    Additional Comments:  Berna BueCheryl Anel Purohit, RN 07/17/2017, 1:28 PM

## 2017-07-17 NOTE — Discharge Instructions (Signed)
Use the Cymbalta that Dr.Clapacs gave you. That may help somewhat with the pain as well as with your depression. Also use Tylenol 2 regular pills 4 times a day for the pain. Heat or ice 20 minutes at a time to help with the pain. Do not fall asleep with either of those on because you can get burns. You must move more. I would follow-up with your regular doctor and have them get you the home health/physical therapy again. please return if you're worse. At the present time he do not meet criteria for inpatient treatment.

## 2017-07-17 NOTE — ED Notes (Signed)
Per family she is not taking care of self  Refuses with get  Sits around in wet diapers all day. Refuses with walk  Does not follow correct diet  Was placed at rehab in past but has signed herself out  Family also thinks she is depressed  They would like her to be evaluated for possible placement  Pt moved to room 18   [facility]

## 2017-07-17 NOTE — ED Notes (Signed)
Pt ambulatory 2 assist to wheelchair upon discharge. Verbalized understanding of discharge instructions, prescription and follow-up care. VSS. Skin warm and dry. A&O x4.

## 2017-07-17 NOTE — ED Notes (Signed)
Dr.Clapacs at bedside  

## 2017-07-31 ENCOUNTER — Telehealth (INDEPENDENT_AMBULATORY_CARE_PROVIDER_SITE_OTHER): Payer: Self-pay

## 2017-07-31 NOTE — Telephone Encounter (Signed)
Patient called asking which size compression stocking she should get to wear and I told her the 20-3330mmHg

## 2017-08-19 DIAGNOSIS — F3341 Major depressive disorder, recurrent, in partial remission: Secondary | ICD-10-CM | POA: Insufficient documentation

## 2018-07-06 IMAGING — DX DG CHEST 1V PORT
1 series · 1 of 1 positions shown · non-contrast
Comparison: No prior.

CLINICAL DATA: General weakness.  Shortness of breath.  Chest pain.

EXAM:
PORTABLE CHEST 1 VIEW

[chest ap]
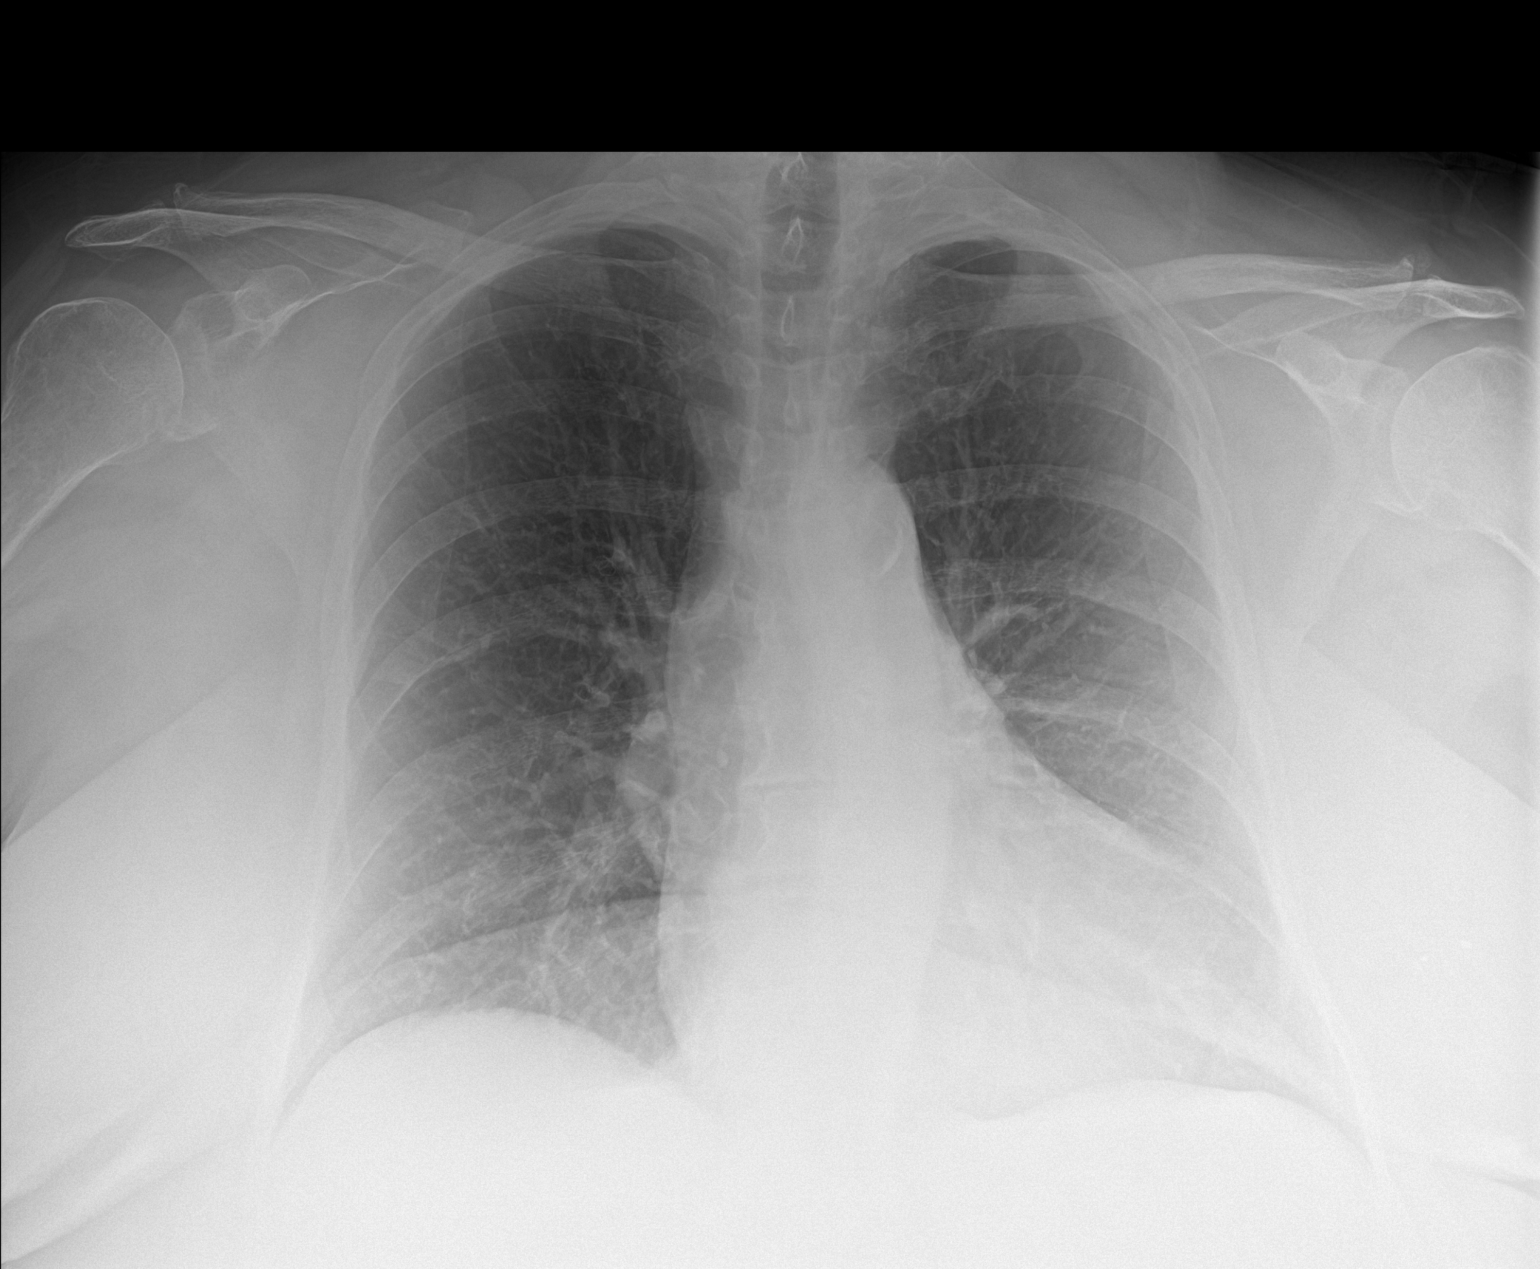

[1 of 1 positions shown; findings below may reference images not displayed]

FINDINGS: Mediastinum and hilar structures normal. Lungs are clear.
Cardiomegaly with normal pulmonary vascularity. Thoracic spine
scoliosis and degenerative change.
IMPRESSION: No acute abnormality.

## 2019-06-12 DIAGNOSIS — N183 Type 2 diabetes mellitus with diabetic chronic kidney disease: Secondary | ICD-10-CM | POA: Insufficient documentation

## 2023-11-05 ENCOUNTER — Emergency Department
Admission: EM | Admit: 2023-11-05 | Discharge: 2023-11-05 | Disposition: A | Discharge status: Home / Self Care | Attending: Emergency Medicine | Admitting: Emergency Medicine

## 2023-11-05 ENCOUNTER — Emergency Department

## 2023-11-05 ENCOUNTER — Other Ambulatory Visit: Payer: Self-pay

## 2023-11-05 ENCOUNTER — Encounter: Payer: Self-pay | Admitting: Emergency Medicine

## 2023-11-05 DIAGNOSIS — M47812 Spondylosis without myelopathy or radiculopathy, cervical region: Secondary | ICD-10-CM | POA: Diagnosis not present

## 2023-11-05 DIAGNOSIS — M542 Cervicalgia: Secondary | ICD-10-CM | POA: Insufficient documentation

## 2023-11-05 DIAGNOSIS — R519 Headache, unspecified: Secondary | ICD-10-CM | POA: Insufficient documentation

## 2023-11-05 MED ORDER — TRAMADOL HCL 50 MG PO TABS: 50.0000 mg | ORAL_TABLET | Freq: Four times a day (QID) | ORAL | 0 refills | Status: DC | PRN

## 2023-11-05 MED ORDER — PREDNISONE 20 MG PO TABS
60.0000 mg | ORAL_TABLET | Freq: Once | ORAL | Status: AC
  Administered 2023-11-05: 60 mg via ORAL
  Filled 2023-11-05: qty 3

## 2023-11-05 MED ORDER — LIDOCAINE 5 % EX PTCH
1.0000 | MEDICATED_PATCH | CUTANEOUS | Status: DC
  Administered 2023-11-05: 1 via TRANSDERMAL
  Filled 2023-11-05: qty 1

## 2023-11-05 MED ORDER — LIDOCAINE 5 % EX PTCH: 1.0000 | MEDICATED_PATCH | CUTANEOUS | 0 refills | Status: AC

## 2023-11-05 MED ORDER — TRAMADOL HCL 50 MG PO TABS
50.0000 mg | ORAL_TABLET | Freq: Once | ORAL | Status: AC
  Administered 2023-11-05: 50 mg via ORAL
  Filled 2023-11-05: qty 1

## 2023-11-05 MED ORDER — PREDNISONE 10 MG (21) PO TBPK: ORAL_TABLET | ORAL | 0 refills | Status: DC

## 2023-11-05 NOTE — ED Notes (Signed)
 Attempted to call report to [facility] with no answer.  Discharge papers reviewed with patient, EMS transport personnel, and attached with paperwork.

## 2023-11-05 NOTE — ED Notes (Signed)
 [facility] called for transport to [facility] , spoke with Athena Bland

## 2023-11-05 NOTE — ED Notes (Signed)
 Called [facility] will pick up around 10:00

## 2023-11-05 NOTE — ED Triage Notes (Addendum)
 Arrives from [facility] via [facility] for r neck pain x 1 week.  C/O pins and needles to neck, no injury, slid out of chair on Monday.Virgel Griffes ray report shows a humeral neck fracture, [place] unsure of details, per report.  Hx CVA in past.  Xray done before patient slid from chair.  Patient states nck pain started before she slid from chair.  VS wnl.  Patient c/O right leg and neck pain. Currently being treated for right leg cellulitis.

## 2023-11-05 NOTE — ED Provider Triage Note (Signed)
 Emergency Medicine Provider Triage Evaluation Note  Doris Curry , a 78 y.o. female  was evaluated in triage.  Pt complains of severe C-spine pain, headache, tingling in the head and neck.  Review of Systems  Positive:  Negative:   Physical Exam  BP 139/62 (BP Location: Left Arm)   Pulse 92   Temp 98.1 F (36.7 C) (Oral)   Resp 16   Wt 99.8 kg   SpO2 93%   BMI 41.57 kg/m  Gen:   Awake, no distress   Resp:  Normal effort  MSK:   Moves extremities without difficulty  Other:    Medical Decision Making  Medically screening exam initiated at 12:04 PM.  Appropriate orders placed.  Doris Curry was informed that the remainder of the evaluation will be completed by another provider, this initial triage assessment does not replace that evaluation, and the importance of remaining in the ED until their evaluation is complete.  CT head and C-spine ordered   Doris Figures, PA-C 11/05/23 1204

## 2023-11-05 NOTE — ED Notes (Signed)
 Daughter, Ivette Marks, updated at bedside to patient's status.

## 2023-11-05 NOTE — ED Provider Notes (Signed)
 [facility] Provider Note    Event Date/Time   First MD Initiated Contact with Patient 11/05/23 1511     (approximate)   History   Neck Pain   HPI  Doris Curry is a 78 y.o. female who presents to the emergency as concerns for neck pain.  Patient states that she has had neck pain for a while but is felt worse this past week.  She denies any unusual exertion.  The pain is located middle part below her side.  She denies legs.  Denies any change in strength of her arms or legs.  Does have some baseline side deficits secondary to stroke but has not been concerning worse.  Per triage note there was concern for possible humerus fracture.  Patient states she feels a spasm along the she denies any pain in her arm.  Denies any change in already limited range of motion of her right upper extremity. No fevers. No chills.      Physical Exam   Triage Vital Signs: ED Triage Vitals  Encounter Vitals Group     BP 11/05/23 1023 139/62     Systolic BP Percentile --      Diastolic BP Percentile --      Pulse Rate 11/05/23 1023 92     Resp 11/05/23 1023 16     Temp 11/05/23 1023 98.1 F (36.7 C)     Temp Source 11/05/23 1023 Oral     SpO2 11/05/23 1023 93 %     Weight 11/05/23 1019 220 lb 0.3 oz (99.8 kg)     Height --      Head Circumference --      Peak Flow --      Pain Score 11/05/23 1019 6     Pain Loc --      Pain Education --      Exclude from Growth Chart --     Most recent vital signs: Vitals:   11/05/23 1023  BP: 139/62  Pulse: 92  Resp: 16  Temp: 98.1 F (36.7 C)  SpO2: 93%   General: Awake, alert, oriented. CV:  Good peripheral perfusion. Regular rate and rhythm. Resp:  Normal effort.  Abd:  No distention.  Other:  Mild tenderness to palpation of cervical spine.    ED Results / Procedures / Treatments   Labs (all labs ordered are listed, but only abnormal results are displayed) Labs Reviewed - No data to  display   EKG  None   RADIOLOGY I independently interpreted and visualized the CT head/cervical spine. My interpretation: No ICH. No cervical fracture. Radiology interpretation:  IMPRESSION:  No CT evidence of acute intracranial abnormality.    No acute fracture or traumatic malalignment of the cervical spine.    Degenerative changes of the cervical spine greatest at C5-6 and  C6-7.    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     PROCEDURES:  Critical Care performed: No   MEDICATIONS ORDERED IN ED: Medications - No data to display   IMPRESSION / MDM / ASSESSMENT AND PLAN / ED COURSE  I reviewed the triage vital signs and the nursing notes.                              Differential diagnosis includes, but is not limited to, fracture, arthritis, meningitis  Patient's presentation is most consistent with acute presentation with potential threat  to life or bodily function.   Patient presented to the emergency department because of concerns for neck pain.  CT scan ordered from triage without any acute fracture.  Does show evidence of degenerative disc disease.  Patient denies any radiculopathy type symptoms to myself.  At this time we will treat with steroids and pain medication.  In terms of possible right humerus fracture given the patient denies any pain or change in use of right upper extremity, do feel this can be followed up as an outpatient.     FINAL CLINICAL IMPRESSION(S) / ED DIAGNOSES   Final diagnoses:  Neck pain     Note:  This document was prepared using Dragon voice recognition software and may include unintentional dictation errors.    Doris Soho, MD 11/05/23 1556

## 2023-12-07 ENCOUNTER — Other Ambulatory Visit: Payer: Self-pay | Admitting: Orthopedic Surgery

## 2023-12-07 DIAGNOSIS — M542 Cervicalgia: Secondary | ICD-10-CM

## 2023-12-15 ENCOUNTER — Ambulatory Visit: Admission: RE | Admit: 2023-12-15 | Source: Ambulatory Visit

## 2023-12-22 ENCOUNTER — Ambulatory Visit
Admission: RE | Admit: 2023-12-22 | Discharge: 2023-12-22 | Disposition: A | Discharge status: Home / Self Care | Source: Ambulatory Visit | Attending: Orthopedic Surgery | Admitting: Orthopedic Surgery

## 2023-12-22 DIAGNOSIS — M542 Cervicalgia: Secondary | ICD-10-CM | POA: Insufficient documentation

## 2024-01-02 ENCOUNTER — Other Ambulatory Visit: Payer: Self-pay

## 2024-01-02 ENCOUNTER — Inpatient Hospital Stay
Admission: EM | Admit: 2024-01-02 | Discharge: 2024-01-05 | DRG: 871 | Disposition: A | Discharge status: Skilled Nursing Facility | Source: Skilled Nursing Facility | Attending: Internal Medicine | Admitting: Internal Medicine

## 2024-01-02 ENCOUNTER — Emergency Department

## 2024-01-02 DIAGNOSIS — K219 Gastro-esophageal reflux disease without esophagitis: Secondary | ICD-10-CM | POA: Diagnosis present

## 2024-01-02 DIAGNOSIS — M542 Cervicalgia: Secondary | ICD-10-CM | POA: Diagnosis present

## 2024-01-02 DIAGNOSIS — Z8249 Family history of ischemic heart disease and other diseases of the circulatory system: Secondary | ICD-10-CM

## 2024-01-02 DIAGNOSIS — Z841 Family history of disorders of kidney and ureter: Secondary | ICD-10-CM

## 2024-01-02 DIAGNOSIS — E1122 Type 2 diabetes mellitus with diabetic chronic kidney disease: Secondary | ICD-10-CM | POA: Diagnosis present

## 2024-01-02 DIAGNOSIS — Z8673 Personal history of transient ischemic attack (TIA), and cerebral infarction without residual deficits: Secondary | ICD-10-CM

## 2024-01-02 DIAGNOSIS — A419 Sepsis, unspecified organism: Principal | ICD-10-CM | POA: Diagnosis present

## 2024-01-02 DIAGNOSIS — M109 Gout, unspecified: Secondary | ICD-10-CM | POA: Diagnosis present

## 2024-01-02 DIAGNOSIS — I129 Hypertensive chronic kidney disease with stage 1 through stage 4 chronic kidney disease, or unspecified chronic kidney disease: Secondary | ICD-10-CM | POA: Diagnosis present

## 2024-01-02 DIAGNOSIS — M21372 Foot drop, left foot: Secondary | ICD-10-CM | POA: Diagnosis present

## 2024-01-02 DIAGNOSIS — E785 Hyperlipidemia, unspecified: Secondary | ICD-10-CM | POA: Diagnosis present

## 2024-01-02 DIAGNOSIS — B372 Candidiasis of skin and nail: Secondary | ICD-10-CM | POA: Diagnosis present

## 2024-01-02 DIAGNOSIS — Z833 Family history of diabetes mellitus: Secondary | ICD-10-CM

## 2024-01-02 DIAGNOSIS — R410 Disorientation, unspecified: Principal | ICD-10-CM

## 2024-01-02 DIAGNOSIS — N390 Urinary tract infection, site not specified: Secondary | ICD-10-CM | POA: Diagnosis not present

## 2024-01-02 DIAGNOSIS — Z803 Family history of malignant neoplasm of breast: Secondary | ICD-10-CM

## 2024-01-02 DIAGNOSIS — I639 Cerebral infarction, unspecified: Secondary | ICD-10-CM | POA: Diagnosis present

## 2024-01-02 DIAGNOSIS — F321 Major depressive disorder, single episode, moderate: Secondary | ICD-10-CM | POA: Diagnosis present

## 2024-01-02 DIAGNOSIS — Z87891 Personal history of nicotine dependence: Secondary | ICD-10-CM

## 2024-01-02 DIAGNOSIS — Z7982 Long term (current) use of aspirin: Secondary | ICD-10-CM

## 2024-01-02 DIAGNOSIS — Z79899 Other long term (current) drug therapy: Secondary | ICD-10-CM

## 2024-01-02 DIAGNOSIS — E66813 Obesity, class 3: Secondary | ICD-10-CM | POA: Diagnosis present

## 2024-01-02 DIAGNOSIS — I10 Essential (primary) hypertension: Secondary | ICD-10-CM | POA: Diagnosis present

## 2024-01-02 DIAGNOSIS — Z66 Do not resuscitate: Secondary | ICD-10-CM | POA: Diagnosis present

## 2024-01-02 DIAGNOSIS — I959 Hypotension, unspecified: Secondary | ICD-10-CM | POA: Diagnosis present

## 2024-01-02 DIAGNOSIS — Z90710 Acquired absence of both cervix and uterus: Secondary | ICD-10-CM

## 2024-01-02 DIAGNOSIS — Z7952 Long term (current) use of systemic steroids: Secondary | ICD-10-CM

## 2024-01-02 DIAGNOSIS — Z820 Family history of epilepsy and other diseases of the nervous system: Secondary | ICD-10-CM

## 2024-01-02 DIAGNOSIS — M21371 Foot drop, right foot: Secondary | ICD-10-CM | POA: Diagnosis present

## 2024-01-02 DIAGNOSIS — L8990 Pressure ulcer of unspecified site, unspecified stage: Secondary | ICD-10-CM | POA: Diagnosis present

## 2024-01-02 DIAGNOSIS — N3281 Overactive bladder: Secondary | ICD-10-CM | POA: Diagnosis present

## 2024-01-02 DIAGNOSIS — Z7401 Bed confinement status: Secondary | ICD-10-CM

## 2024-01-02 DIAGNOSIS — G8191 Hemiplegia, unspecified affecting right dominant side: Secondary | ICD-10-CM | POA: Diagnosis present

## 2024-01-02 DIAGNOSIS — L89619 Pressure ulcer of right heel, unspecified stage: Secondary | ICD-10-CM | POA: Diagnosis present

## 2024-01-02 DIAGNOSIS — Z6841 Body Mass Index (BMI) 40.0 and over, adult: Secondary | ICD-10-CM

## 2024-01-02 DIAGNOSIS — N170 Acute kidney failure with tubular necrosis: Secondary | ICD-10-CM | POA: Diagnosis present

## 2024-01-02 DIAGNOSIS — L89159 Pressure ulcer of sacral region, unspecified stage: Secondary | ICD-10-CM | POA: Diagnosis present

## 2024-01-02 DIAGNOSIS — D72829 Elevated white blood cell count, unspecified: Secondary | ICD-10-CM

## 2024-01-02 DIAGNOSIS — Z823 Family history of stroke: Secondary | ICD-10-CM

## 2024-01-02 DIAGNOSIS — N179 Acute kidney failure, unspecified: Secondary | ICD-10-CM | POA: Diagnosis present

## 2024-01-02 DIAGNOSIS — R9431 Abnormal electrocardiogram [ECG] [EKG]: Secondary | ICD-10-CM | POA: Diagnosis present

## 2024-01-02 DIAGNOSIS — N1832 Chronic kidney disease, stage 3b: Secondary | ICD-10-CM | POA: Diagnosis present

## 2024-01-02 LAB — CBC WITH DIFFERENTIAL/PLATELET
Abs Immature Granulocytes: 0.1 10*3/uL — ABNORMAL HIGH (ref 0.00–0.07)
Basophils Absolute: 0 10*3/uL (ref 0.0–0.1)
Basophils Relative: 0 %
Eosinophils Absolute: 0.1 10*3/uL (ref 0.0–0.5)
Eosinophils Relative: 0 %
HCT: 37 % (ref 36.0–46.0)
Hemoglobin: 11.3 g/dL — ABNORMAL LOW (ref 12.0–15.0)
Immature Granulocytes: 1 %
Lymphocytes Relative: 7 %
Lymphs Abs: 1 10*3/uL (ref 0.7–4.0)
MCH: 28.7 pg (ref 26.0–34.0)
MCHC: 30.5 g/dL (ref 30.0–36.0)
MCV: 93.9 fL (ref 80.0–100.0)
Monocytes Absolute: 1.3 10*3/uL — ABNORMAL HIGH (ref 0.1–1.0)
Monocytes Relative: 10 %
Neutro Abs: 11.1 10*3/uL — ABNORMAL HIGH (ref 1.7–7.7)
Neutrophils Relative %: 82 %
Platelets: 254 10*3/uL (ref 150–400)
RBC: 3.94 MIL/uL (ref 3.87–5.11)
RDW: 17.5 % — ABNORMAL HIGH (ref 11.5–15.5)
WBC: 13.6 10*3/uL — ABNORMAL HIGH (ref 4.0–10.5)
nRBC: 0 % (ref 0.0–0.2)

## 2024-01-02 LAB — BASIC METABOLIC PANEL WITH GFR
Anion gap: 10 (ref 5–15)
BUN: 55 mg/dL — ABNORMAL HIGH (ref 8–23)
CO2: 22 mmol/L (ref 22–32)
Calcium: 9.4 mg/dL (ref 8.9–10.3)
Chloride: 107 mmol/L (ref 98–111)
Creatinine, Ser: 1.96 mg/dL — ABNORMAL HIGH (ref 0.44–1.00)
GFR, Estimated: 26 mL/min — ABNORMAL LOW (ref >60)
Glucose, Bld: 136 mg/dL — ABNORMAL HIGH (ref 70–99)
Potassium: 4.3 mmol/L (ref 3.5–5.1)
Sodium: 139 mmol/L (ref 135–145)

## 2024-01-02 LAB — TROPONIN I (HIGH SENSITIVITY): Troponin I (High Sensitivity): 16 ng/L (ref <18)

## 2024-01-02 MED ORDER — LACTATED RINGERS IV BOLUS
1000.0000 mL | Freq: Once | INTRAVENOUS | Status: AC
  Administered 2024-01-02: 1000 mL via INTRAVENOUS

## 2024-01-02 MED ORDER — SODIUM CHLORIDE 0.9 % IV SOLN
2.0000 g | Freq: Once | INTRAVENOUS | Status: AC
  Administered 2024-01-02: 2 g via INTRAVENOUS
  Filled 2024-01-02: qty 12.5

## 2024-01-02 MED ORDER — VANCOMYCIN HCL IN DEXTROSE 1-5 GM/200ML-% IV SOLN
1000.0000 mg | Freq: Once | INTRAVENOUS | Status: AC
  Administered 2024-01-02: 1000 mg via INTRAVENOUS
  Filled 2024-01-02: qty 200

## 2024-01-02 NOTE — ED Triage Notes (Addendum)
 From [facility], Increased confusion x 2 days. Facility has been giving IV infusion for dehydration and abnormal labs per family request

## 2024-01-02 NOTE — ED Notes (Signed)
 Pt very difficult stick, unable to obtain blood cultures and lactic acid. Will call phlebotomy to straight stick pt.

## 2024-01-02 NOTE — ED Notes (Signed)
 Phlebotomy unable to get blood, will send another phlebotomist

## 2024-01-02 NOTE — ED Notes (Signed)
 Spoke with lab, they will send phlebotomist to collect the remaining labs.

## 2024-01-02 NOTE — ED Provider Notes (Signed)
 [facility] Provider Note    Event Date/Time   First MD Initiated Contact with Patient 01/02/24 2116     (approximate)   History   Altered Mental Status   HPI  Doris Curry is a 78 y.o. female who presents to the emergency department today because of concerns for confusion.  Symptoms have been going on for the past couple of days.  Most of the history is obtained from family at bedside.  The patient is currently being worked up for bad neck pain and recently underwent an MRI however family noticed over the past couple days increased confusion.  They also felt that the patient has not been eating or drinking much.  Blood work had been obtained at her living facility which was concerning for leukocytosis and elevated creatinine per family. She has had a couple of choking episodes recently. No reported fevers. No known history of UTI.     Physical Exam   Triage Vital Signs: ED Triage Vitals  Encounter Vitals Group     BP 01/02/24 2048 93/67     Girls Systolic BP Percentile --      Girls Diastolic BP Percentile --      Boys Systolic BP Percentile --      Boys Diastolic BP Percentile --      Pulse Rate 01/02/24 2051 97     Resp 01/02/24 2048 18     Temp 01/02/24 2053 97.7 F (36.5 C)     Temp Source 01/02/24 2053 Oral     SpO2 01/02/24 2053 94 %     Weight --      Height --      Head Circumference --      Peak Flow --      Pain Score --      Pain Loc --      Pain Education --      Exclude from Growth Chart --     Most recent vital signs: Vitals:   01/02/24 2051 01/02/24 2053  BP:    Pulse: 97   Resp:    Temp:  97.7 F (36.5 C)  SpO2:  94%   General: Awake, alert. CV:  Good peripheral perfusion. Regular rate and rhythm. Resp:  Normal effort. Lungs clear. Abd:  No distention. Non tender.   ED Results / Procedures / Treatments   Labs (all labs ordered are listed, but only abnormal results are displayed) Labs Reviewed  CBC WITH  DIFFERENTIAL/PLATELET  BASIC METABOLIC PANEL WITH GFR  URINALYSIS, ROUTINE W REFLEX MICROSCOPIC     EKG  I, Guadalupe Eagles, attending physician, personally viewed and interpreted this EKG  EKG Time: 2052 Rate: 94 Rhythm: sinus rhythm Axis: normal Intervals: qtc 399 QRS: narrow, q waves v1 ST changes: st elevation V3, v4, v5, I, II Impression: abnormal ekg  I, Guadalupe Eagles, attending physician, personally viewed and interpreted this EKG  EKG Time: 2142 Rate: 93 Rhythm: sinus rhythm Axis: normal Intervals: qtc 402 QRS: narrow, q waves v1 ST changes: ST elevatio I, II, v3, v4, v5 Impression: abnormal ekg   RADIOLOGY I independently interpreted and visualized the CXR. My interpretation: No pneumonia Radiology interpretation:  IMPRESSION:  No acute chest findings.      PROCEDURES:  Critical Care performed: Yes  CRITICAL CARE Performed by: Guadalupe Eagles   Total critical care time: 30 minutes  Critical care time was exclusive of separately billable procedures and treating other patients.  Critical care was necessary to treat  or prevent imminent or life-threatening deterioration.  Critical care was time spent personally by me on the following activities: development of treatment plan with patient and/or surrogate as well as nursing, discussions with consultants, evaluation of patient's response to treatment, examination of patient, obtaining history from patient or surrogate, ordering and performing treatments and interventions, ordering and review of laboratory studies, ordering and review of radiographic studies, pulse oximetry and re-evaluation of patient's condition.   Procedures    MEDICATIONS ORDERED IN ED: Medications - No data to display   IMPRESSION / MDM / ASSESSMENT AND PLAN / ED COURSE  I reviewed the triage vital signs and the nursing notes.                              Differential diagnosis includes, but is not limited to, infection,  dehydration, electrolyte abnormality  Patient's presentation is most consistent with acute presentation with potential threat to life or bodily function.   The patient is on the cardiac monitor to evaluate for evidence of arrhythmia and/or significant heart rate changes.  Patient presented to the emergency department today because of concerns for confusion and possible dehydration.  Patient's family is present at bedside.  On exam patient is awake and alert.  Not completely oriented.  Patient was found to be hypotensive however unclear if true reading or not.  Blood work was ordered which showed mild leukocytosis.  Additionally lactic acid and blood cultures were ordered.  Chest x-ray did not show any pneumonia.  Initial EKG and repeat EKG both concerning for somewhat diffuse ST elevation.  Patient however denies any chest pain.  I did discuss with Dr. Florencio who reviewed the EKGs.  At this time he had low concern that they represented ACS.  Thought pericarditis more likely.  However given patient's lack of pain did not recommend any treatment at this time.  Initial troponin was negative.  Patient was ordered broad-spectrum IV antibiotics and IV fluids.  Awaiting UA and further blood work at time of signout.      FINAL CLINICAL IMPRESSION(S) / ED DIAGNOSES   Final diagnoses:  Confusion  AKI (acute kidney injury) (HCC)  Leukocytosis, unspecified type        Note:  This document was prepared using Dragon voice recognition software and may include unintentional dictation errors.    Floy Roberts, MD 01/02/24 2322

## 2024-01-03 ENCOUNTER — Inpatient Hospital Stay

## 2024-01-03 DIAGNOSIS — Z8249 Family history of ischemic heart disease and other diseases of the circulatory system: Secondary | ICD-10-CM | POA: Diagnosis not present

## 2024-01-03 DIAGNOSIS — E785 Hyperlipidemia, unspecified: Secondary | ICD-10-CM | POA: Diagnosis present

## 2024-01-03 DIAGNOSIS — N1832 Chronic kidney disease, stage 3b: Secondary | ICD-10-CM | POA: Diagnosis present

## 2024-01-03 DIAGNOSIS — B372 Candidiasis of skin and nail: Secondary | ICD-10-CM | POA: Diagnosis present

## 2024-01-03 DIAGNOSIS — A419 Sepsis, unspecified organism: Secondary | ICD-10-CM | POA: Diagnosis present

## 2024-01-03 DIAGNOSIS — E66813 Obesity, class 3: Secondary | ICD-10-CM | POA: Diagnosis present

## 2024-01-03 DIAGNOSIS — L8990 Pressure ulcer of unspecified site, unspecified stage: Secondary | ICD-10-CM | POA: Diagnosis present

## 2024-01-03 DIAGNOSIS — Z6841 Body Mass Index (BMI) 40.0 and over, adult: Secondary | ICD-10-CM | POA: Diagnosis not present

## 2024-01-03 DIAGNOSIS — L89619 Pressure ulcer of right heel, unspecified stage: Secondary | ICD-10-CM | POA: Diagnosis present

## 2024-01-03 DIAGNOSIS — L89159 Pressure ulcer of sacral region, unspecified stage: Secondary | ICD-10-CM | POA: Diagnosis present

## 2024-01-03 DIAGNOSIS — R9431 Abnormal electrocardiogram [ECG] [EKG]: Secondary | ICD-10-CM | POA: Diagnosis present

## 2024-01-03 DIAGNOSIS — G8191 Hemiplegia, unspecified affecting right dominant side: Secondary | ICD-10-CM | POA: Diagnosis present

## 2024-01-03 DIAGNOSIS — N179 Acute kidney failure, unspecified: Secondary | ICD-10-CM | POA: Diagnosis not present

## 2024-01-03 DIAGNOSIS — Z823 Family history of stroke: Secondary | ICD-10-CM | POA: Diagnosis not present

## 2024-01-03 DIAGNOSIS — M109 Gout, unspecified: Secondary | ICD-10-CM | POA: Insufficient documentation

## 2024-01-03 DIAGNOSIS — N390 Urinary tract infection, site not specified: Secondary | ICD-10-CM

## 2024-01-03 DIAGNOSIS — Z7982 Long term (current) use of aspirin: Secondary | ICD-10-CM | POA: Diagnosis not present

## 2024-01-03 DIAGNOSIS — Z841 Family history of disorders of kidney and ureter: Secondary | ICD-10-CM | POA: Diagnosis not present

## 2024-01-03 DIAGNOSIS — Z7401 Bed confinement status: Secondary | ICD-10-CM | POA: Diagnosis not present

## 2024-01-03 DIAGNOSIS — N170 Acute kidney failure with tubular necrosis: Secondary | ICD-10-CM | POA: Diagnosis present

## 2024-01-03 DIAGNOSIS — I639 Cerebral infarction, unspecified: Secondary | ICD-10-CM

## 2024-01-03 DIAGNOSIS — D72829 Elevated white blood cell count, unspecified: Secondary | ICD-10-CM | POA: Diagnosis not present

## 2024-01-03 DIAGNOSIS — Z833 Family history of diabetes mellitus: Secondary | ICD-10-CM | POA: Diagnosis not present

## 2024-01-03 DIAGNOSIS — Z79899 Other long term (current) drug therapy: Secondary | ICD-10-CM | POA: Diagnosis not present

## 2024-01-03 DIAGNOSIS — F321 Major depressive disorder, single episode, moderate: Secondary | ICD-10-CM

## 2024-01-03 DIAGNOSIS — Z66 Do not resuscitate: Secondary | ICD-10-CM | POA: Diagnosis present

## 2024-01-03 DIAGNOSIS — Z8673 Personal history of transient ischemic attack (TIA), and cerebral infarction without residual deficits: Secondary | ICD-10-CM | POA: Diagnosis not present

## 2024-01-03 DIAGNOSIS — M542 Cervicalgia: Secondary | ICD-10-CM | POA: Diagnosis present

## 2024-01-03 DIAGNOSIS — E1122 Type 2 diabetes mellitus with diabetic chronic kidney disease: Secondary | ICD-10-CM | POA: Diagnosis present

## 2024-01-03 DIAGNOSIS — I129 Hypertensive chronic kidney disease with stage 1 through stage 4 chronic kidney disease, or unspecified chronic kidney disease: Secondary | ICD-10-CM | POA: Diagnosis present

## 2024-01-03 DIAGNOSIS — Z87891 Personal history of nicotine dependence: Secondary | ICD-10-CM | POA: Diagnosis not present

## 2024-01-03 LAB — TROPONIN I (HIGH SENSITIVITY)
Troponin I (High Sensitivity): 24 ng/L — ABNORMAL HIGH (ref <18)
Troponin I (High Sensitivity): 37 ng/L — ABNORMAL HIGH (ref <18)
Troponin I (High Sensitivity): 47 ng/L — ABNORMAL HIGH (ref <18)

## 2024-01-03 LAB — BLOOD CULTURE ID PANEL (REFLEXED) - BCID2

## 2024-01-03 LAB — URINALYSIS, ROUTINE W REFLEX MICROSCOPIC
Bilirubin Urine: NEGATIVE
Glucose, UA: NEGATIVE mg/dL
Ketones, ur: NEGATIVE mg/dL
Nitrite: NEGATIVE
Protein, ur: 30 mg/dL — AB
RBC / HPF: >50 RBC/hpf (ref 0–5)
Specific Gravity, Urine: 1.025 (ref 1.005–1.030)
WBC, UA: >50 WBC/hpf (ref 0–5)
pH: 5 (ref 5.0–8.0)

## 2024-01-03 LAB — CBC
HCT: 47.8 % — ABNORMAL HIGH (ref 36.0–46.0)
Hemoglobin: 13.6 g/dL (ref 12.0–15.0)
MCH: 28.1 pg (ref 26.0–34.0)
MCHC: 28.5 g/dL — ABNORMAL LOW (ref 30.0–36.0)
MCV: 98.8 fL (ref 80.0–100.0)
Platelets: 118 10*3/uL — ABNORMAL LOW (ref 150–400)
RBC: 4.84 MIL/uL (ref 3.87–5.11)
RDW: 17.7 % — ABNORMAL HIGH (ref 11.5–15.5)
WBC: 9.3 10*3/uL (ref 4.0–10.5)
nRBC: 0 % (ref 0.0–0.2)

## 2024-01-03 LAB — BASIC METABOLIC PANEL WITH GFR
Anion gap: 9 (ref 5–15)
BUN: 50 mg/dL — ABNORMAL HIGH (ref 8–23)
CO2: 23 mmol/L (ref 22–32)
Calcium: 9 mg/dL (ref 8.9–10.3)
Chloride: 107 mmol/L (ref 98–111)
Creatinine, Ser: 1.52 mg/dL — ABNORMAL HIGH (ref 0.44–1.00)
GFR, Estimated: 35 mL/min — ABNORMAL LOW (ref >60)
Glucose, Bld: 148 mg/dL — ABNORMAL HIGH (ref 70–99)
Potassium: 4.2 mmol/L (ref 3.5–5.1)
Sodium: 139 mmol/L (ref 135–145)

## 2024-01-03 LAB — LACTIC ACID, PLASMA
Lactic Acid, Venous: 1.1 mmol/L (ref 0.5–1.9)
Lactic Acid, Venous: 1.1 mmol/L (ref 0.5–1.9)

## 2024-01-03 LAB — MRSA NEXT GEN BY PCR, NASAL: MRSA by PCR Next Gen: NOT DETECTED

## 2024-01-03 MED ORDER — SODIUM CHLORIDE 0.9 % IV SOLN: INTRAVENOUS | Status: DC

## 2024-01-03 MED ORDER — SENNOSIDES-DOCUSATE SODIUM 8.6-50 MG PO TABS
2.0000 | ORAL_TABLET | Freq: Every day | ORAL | Status: DC
  Administered 2024-01-03 – 2024-01-04 (×2): 2 via ORAL
  Filled 2024-01-03 (×2): qty 2

## 2024-01-03 MED ORDER — CICLOPIROX OLAMINE 0.77 % EX CREA: 1.0000 | TOPICAL_CREAM | Freq: Every day | CUTANEOUS | Status: DC

## 2024-01-03 MED ORDER — TIZANIDINE HCL 4 MG PO TABS
4.0000 mg | ORAL_TABLET | Freq: Two times a day (BID) | ORAL | Status: DC
  Administered 2024-01-03 – 2024-01-05 (×5): 4 mg via ORAL
  Filled 2024-01-03 (×3): qty 1
  Filled 2024-01-03: qty 2
  Filled 2024-01-03: qty 1

## 2024-01-03 MED ORDER — SODIUM CHLORIDE 0.9 % IV SOLN
2.0000 g | INTRAVENOUS | Status: DC
  Administered 2024-01-03 – 2024-01-04 (×2): 2 g via INTRAVENOUS
  Filled 2024-01-03 (×3): qty 20

## 2024-01-03 MED ORDER — ASPIRIN 81 MG PO TBEC
81.0000 mg | DELAYED_RELEASE_TABLET | Freq: Every day | ORAL | Status: DC
  Administered 2024-01-03 – 2024-01-05 (×3): 81 mg via ORAL
  Filled 2024-01-03 (×3): qty 1

## 2024-01-03 MED ORDER — VANCOMYCIN HCL 500 MG/100ML IV SOLN
500.0000 mg | INTRAVENOUS | Status: DC
  Administered 2024-01-04: 500 mg via INTRAVENOUS
  Filled 2024-01-03: qty 100

## 2024-01-03 MED ORDER — POLYETHYLENE GLYCOL 3350 17 G PO PACK: 17.0000 g | PACK | Freq: Every day | ORAL | Status: DC | PRN

## 2024-01-03 MED ORDER — TRAZODONE HCL 50 MG PO TABS
50.0000 mg | ORAL_TABLET | Freq: Every evening | ORAL | Status: DC | PRN
  Administered 2024-01-04: 50 mg via ORAL
  Filled 2024-01-03: qty 1

## 2024-01-03 MED ORDER — DEXAMETHASONE SODIUM PHOSPHATE 10 MG/ML IJ SOLN
10.0000 mg | Freq: Once | INTRAMUSCULAR | Status: AC
  Administered 2024-01-03: 10 mg via INTRAVENOUS
  Filled 2024-01-03: qty 1

## 2024-01-03 MED ORDER — GABAPENTIN 300 MG PO CAPS
300.0000 mg | ORAL_CAPSULE | Freq: Three times a day (TID) | ORAL | Status: DC
  Administered 2024-01-03 – 2024-01-05 (×7): 300 mg via ORAL
  Filled 2024-01-03 (×7): qty 1

## 2024-01-03 MED ORDER — HYDROCORTISONE SOD SUC (PF) 100 MG IJ SOLR
100.0000 mg | Freq: Once | INTRAMUSCULAR | Status: AC
  Administered 2024-01-03: 100 mg via INTRAVENOUS
  Filled 2024-01-03: qty 2

## 2024-01-03 MED ORDER — SODIUM CHLORIDE 0.9 % IV BOLUS
500.0000 mL | Freq: Once | INTRAVENOUS | Status: AC
  Administered 2024-01-03: 500 mL via INTRAVENOUS

## 2024-01-03 MED ORDER — HEPARIN SODIUM (PORCINE) 5000 UNIT/ML IJ SOLN
5000.0000 [IU] | Freq: Three times a day (TID) | INTRAMUSCULAR | Status: DC
  Administered 2024-01-03 – 2024-01-05 (×8): 5000 [IU] via SUBCUTANEOUS
  Filled 2024-01-03 (×7): qty 1

## 2024-01-03 MED ORDER — KETOCONAZOLE 2 % EX CREA
1.0000 | TOPICAL_CREAM | Freq: Every day | CUTANEOUS | Status: DC
  Filled 2024-01-03 (×2): qty 15

## 2024-01-03 MED ORDER — ALBUMIN HUMAN 25 % IV SOLN
INTRAVENOUS | Status: AC
  Filled 2024-01-03: qty 50

## 2024-01-03 MED ORDER — ACETAMINOPHEN 325 MG PO TABS: 650.0000 mg | ORAL_TABLET | Freq: Four times a day (QID) | ORAL | Status: DC | PRN

## 2024-01-03 MED ORDER — GUAIFENESIN ER 600 MG PO TB12: 600.0000 mg | ORAL_TABLET | Freq: Two times a day (BID) | ORAL | Status: DC | PRN

## 2024-01-03 MED ORDER — HYDROCODONE-ACETAMINOPHEN 5-325 MG PO TABS
1.0000 | ORAL_TABLET | Freq: Four times a day (QID) | ORAL | Status: DC | PRN
  Administered 2024-01-03: 1 via ORAL
  Filled 2024-01-03: qty 1

## 2024-01-03 MED ORDER — ONDANSETRON HCL 4 MG/2ML IJ SOLN: 4.0000 mg | Freq: Three times a day (TID) | INTRAMUSCULAR | Status: DC | PRN

## 2024-01-03 MED ORDER — NYSTATIN 100000 UNIT/GM EX POWD
1.0000 | Freq: Three times a day (TID) | CUTANEOUS | Status: DC
  Administered 2024-01-03 – 2024-01-05 (×5): 1 via TOPICAL
  Filled 2024-01-03: qty 15

## 2024-01-03 MED ORDER — ATORVASTATIN CALCIUM 20 MG PO TABS
40.0000 mg | ORAL_TABLET | Freq: Every day | ORAL | Status: DC
  Administered 2024-01-03 – 2024-01-05 (×3): 40 mg via ORAL
  Filled 2024-01-03 (×3): qty 2

## 2024-01-03 MED ORDER — LORATADINE 10 MG PO TABS
10.0000 mg | ORAL_TABLET | Freq: Every day | ORAL | Status: DC
  Administered 2024-01-03 – 2024-01-05 (×3): 10 mg via ORAL
  Filled 2024-01-03 (×3): qty 1

## 2024-01-03 MED ORDER — FLUTICASONE PROPIONATE 50 MCG/ACT NA SUSP
2.0000 | Freq: Every day | NASAL | Status: DC
  Administered 2024-01-04 – 2024-01-05 (×2): 2 via NASAL
  Filled 2024-01-03: qty 16

## 2024-01-03 MED ORDER — CHLORHEXIDINE GLUCONATE 0.12 % MT SOLN: 10.0000 mL | Freq: Four times a day (QID) | OROMUCOSAL | Status: DC | PRN

## 2024-01-03 MED ORDER — ALBUMIN HUMAN 25 % IV SOLN
25.0000 g | Freq: Once | INTRAVENOUS | Status: AC
  Administered 2024-01-03: 25 g via INTRAVENOUS
  Filled 2024-01-03: qty 100

## 2024-01-03 MED ORDER — VANCOMYCIN HCL 1500 MG/300ML IV SOLN
1500.0000 mg | Freq: Once | INTRAVENOUS | Status: AC
  Administered 2024-01-03: 1500 mg via INTRAVENOUS
  Filled 2024-01-03: qty 300

## 2024-01-03 MED ORDER — DULOXETINE HCL 30 MG PO CPEP
30.0000 mg | ORAL_CAPSULE | Freq: Two times a day (BID) | ORAL | Status: DC
  Administered 2024-01-03 – 2024-01-05 (×5): 30 mg via ORAL
  Filled 2024-01-03 (×5): qty 1

## 2024-01-03 MED ORDER — MIDODRINE HCL 5 MG PO TABS
10.0000 mg | ORAL_TABLET | Freq: Three times a day (TID) | ORAL | Status: DC
  Administered 2024-01-03 (×2): 10 mg via ORAL
  Filled 2024-01-03 (×3): qty 2

## 2024-01-03 NOTE — ED Notes (Signed)
 2nd phlebotomist bedside attempted to get labs, unsuccessful

## 2024-01-03 NOTE — ED Provider Notes (Signed)
-----------------------------------------   12:31 AM on 01/03/2024 -----------------------------------------   BP 93/64; UA + for LE, >50 WBC. Will consult hospitalist service for evaluation and admission.   Robinette Vermell PARAS, MD 01/03/24 [phone]

## 2024-01-03 NOTE — H&P (Signed)
 History and Physical    Doris Curry FMW:[id number] DOB: 22-Feb-1946 DOA: 01/02/2024  Referring MD/NP/PA:   PCP: [facility]   Patient coming from:  The patient is coming from SNF   Chief Complaint: Burning with urination, confusion  HPI: Doris Curry is a 78 y.o. female with medical history significant of hypertension, hyperlipidemia, stroke, gout, GERD, depression, CKD-3B, morbid obesity, lymphedema, chronic venous insufficient change in legs, chronic pain, overactive bladder, bedbound, who presents with burning with urination and confusion.  Per her daughter at the bedside, patient was noted to have intermittent mild confusion in the past 2 days.  When I saw patient in ED, her confusion has resolved.  She is alert oriented x 3.  She reports burning on urination, no dysuria or hematuria.  Patient does not have chest pain, cough, SOB.  No nausea, vomiting, diarrhea or abdominal pain.  No symptoms of UTI.  She states that she has neck pain which is managed by [facility]. She had MRI of C-spin on 12/22/23 as outpt.  Patient was found to have hypotension with blood pressure 73/63, which improved to SBP of 90s after giving 2 L LR bolus in ED.   MRI of C-spine on 12/22/2023 Edema throughout the dens and involving the lateral masses of C1 and C2. Edema within the soft tissues surrounding the dens and adjacent to the bilateral atlantoaxial articulations. Recommend correlation with history of trauma and consider CT of the cervical spine for further evaluation. Alternatively, findings could be present in the setting of inflammatory or crystalline arthropathy versus infection.   Prevertebral edema extending from C2 to the upper thoracic spine.   Degenerative changes as above. No high-grade spinal canal stenosis or cord compression. No high-grade foraminal stenosis.   Data reviewed independently and ED Course: pt was found to have WBC 13.6, worsening renal function, UA (turbid  appearance, moderate amount leukocyte, many bacteria, WBC> 50), troponin 16.  Temperature normal, heart rate 97--> 90, RR 18, oxygen sat 95% on room air.  Chest x-ray negative.  Patient is admitted to PCU as inpt   EKG: I have personally reviewed.  Sinus rhythm, QTc 399, LAE, ST elevation diffusely except for lead III, Q-wave in lead III/aVF   Review of Systems:   General: no fevers, chills, no body weight gain, has fatigue HEENT: no blurry vision, hearing changes or sore throat Respiratory: no dyspnea, coughing, wheezing CV: no chest pain, no palpitations GI: no nausea, vomiting, abdominal pain, diarrhea, constipation GU: no dysuria, has burning on urination, no hematuria  Ext: has leg edema Neuro: no unilateral weakness, numbness, or tingling, no vision change or hearing loss Skin: has pressure sacral ulcer and ulcer in right heel.  MSK: No muscle spasm, no deformity, no limitation of range of movement in spin. Has neck pain Heme: No easy bruising.  Travel history: No recent long distant travel.   Allergy:  Allergies  Allergen Reactions   Citalopram     Other reaction(s): Blood Disorder Easy bruising   Bupropion Anxiety    150 mg.  Has tolerated 75    Past Medical History:  Diagnosis Date   Chronic kidney disease    Depression    Gout, joint    Hyperlipidemia, unspecified    Hypertension    Osteoarthritis of knee 07/14/2012   right   Overactive bladder    Stroke [facility])     Past Surgical History:  Procedure Laterality Date   ABDOMINAL HYSTERECTOMY  1987   partial  MASTECTOMY PARTIAL / LUMPECTOMY      Social History:  reports that she quit smoking about 21 years ago. Her smoking use included cigarettes. She started smoking about 41 years ago. She has a 30 pack-year smoking history. She has never used smokeless tobacco. She reports that she does not drink alcohol and does not use drugs.  Family History:  Family History  Problem Relation Age of Onset   Breast  cancer Mother    Alzheimer's disease Mother    Hypertension Father    Kidney disease Father    Stroke Father    Stroke Paternal Grandfather    Diabetes type II Paternal Grandfather    Diabetes type II Paternal Aunt    Diabetes type II Daughter      Prior to Admission medications   Medication Sig Start Date End Date Taking? Authorizing Provider  aspirin EC 81 MG tablet Take 81 mg by mouth daily. At 8 am    [provider]  colchicine 0.6 MG tablet Take 0.6 mg by mouth 3 (three) times daily as needed.    [provider]  DULoxetine  (CYMBALTA ) 30 MG capsule Take 1 capsule (30 mg total) by mouth 2 (two) times daily. 07/17/17 10/15/17  Clapacs, Norleen DASEN, MD  esomeprazole (NEXIUM) 40 MG capsule Take 40 mg by mouth daily at 6 (six) AM.    [provider]  fluocinonide cream (LIDEX) 0.05 % Apply 1 application topically 2 times daily as needed to eczema of left hand.    [provider]  fluticasone  (FLONASE ) 50 MCG/ACT nasal spray Place 2 sprays into both nostrils daily. 8 am    [provider]  furosemide (LASIX) 20 MG tablet Take 20 mg by mouth every other day. 8 am    [provider]  lidocaine  (LIDODERM ) 5 % Place 1 patch onto the skin daily. At 8 am to right knee for pain. Remove patch at 8 pm. Remove & Discard patch within 12 hours or as directed by MD    [provider]  lisinopril (PRINIVIL,ZESTRIL) 20 MG tablet Take 20 mg by mouth daily. At 8 am    [provider]  magnesium hydroxide (MILK OF MAGNESIA) 400 MG/5ML suspension Take 30 mLs by mouth See admin instructions. Constipation/no BM for 2 days, every 4 hours prn.    [provider]  polyethylene glycol (MIRALAX  / GLYCOLAX ) packet Take 17 g by mouth daily. Mix one tablespoon with 8oz of your favorite juice or water every day until you are having soft formed stools. Then start taking once daily if you didn't have a stool the day before. 02/26/17   Lang Dover,  MD  Polyvinyl Alcohol-Povidone PF (REFRESH) 1.4-0.6 % SOLN Place 2 drops into the right eye 4 (four) times daily. At 8 am, 1 pm, 5 pm, 8 pm    [provider]  pravastatin (PRAVACHOL) 40 MG tablet Take 40 mg by mouth at bedtime. 8 pm    [provider]  predniSONE  (STERAPRED UNI-PAK 21 TAB) 10 MG (21) TBPK tablet Per packaging instructions 11/05/23   Floy Roberts, MD  traMADol  (ULTRAM ) 50 MG tablet Take 1 tablet (50 mg total) by mouth every 6 (six) hours as needed. 11/05/23 11/04/24  Goodman, Graydon, MD  traZODone (DESYREL) 50 MG tablet Take 50 mg by mouth at bedtime as needed for sleep.    [provider]    Physical Exam: Vitals:   01/02/24 2330 01/03/24 0000 01/03/24 0030 01/03/24 0130  BP: ROLLEN)  85/53 91/64 99/87  113/86  Pulse:      Resp: 14 18 (!) 22 11  Temp:      TempSrc:      SpO2:   96%    General: Not in acute distress HEENT:       Eyes: PERRL, EOMI, no jaundice       ENT: No discharge from the ears and nose, no pharynx injection, no tonsillar enlargement.        Neck: No JVD, no bruit, no mass felt. Heme: No neck lymph node enlargement. Cardiac: S1/S2, RRR, No murmurs, No gallops or rubs. Respiratory: No rales, wheezing, rhonchi or rubs. GI: Soft, nondistended, nontender, no rebound pain, no organomegaly, BS present. GU: No hematuria Ext: Has chronic venous insufficiency changes in both legs, has chronic lymphedema in both legs, 1+DP/PT pulse bilaterally. Musculoskeletal: No joint deformities, No joint redness or warmth, no limitation of ROM in spin. Skin: Has sacral pressure ulcer.  Has a small pressure ulcer in right heel. Pt has intertriginous candidiasis in abdominal wall folds    Neuro: Alert, oriented X3, cranial nerves II-XII grossly intact. Psych: Patient is not psychotic, no suicidal or hemocidal ideation.  Labs on Admission: I have personally reviewed following labs and imaging studies  CBC: Recent Labs  Lab 01/02/24 2120  WBC  13.6*  NEUTROABS 11.1*  HGB 11.3*  HCT 37.0  MCV 93.9  PLT 254   Basic Metabolic Panel: Recent Labs  Lab 01/02/24 2120  NA 139  K 4.3  CL 107  CO2 22  GLUCOSE 136*  BUN 55*  CREATININE 1.96*  CALCIUM 9.4   GFR: CrCl cannot be calculated (Unknown ideal weight.). Liver Function Tests: No results for input(s): AST, ALT, ALKPHOS, BILITOT, PROT, ALBUMIN in the last 168 hours. No results for input(s): LIPASE, AMYLASE in the last 168 hours. No results for input(s): AMMONIA in the last 168 hours. Coagulation Profile: No results for input(s): INR, PROTIME in the last 168 hours. Cardiac Enzymes: No results for input(s): CKTOTAL, CKMB, CKMBINDEX, TROPONINI in the last 168 hours. BNP (last 3 results) No results for input(s): PROBNP in the last 8760 hours. HbA1C: No results for input(s): HGBA1C in the last 72 hours. CBG: No results for input(s): GLUCAP in the last 168 hours. Lipid Profile: No results for input(s): CHOL, HDL, LDLCALC, TRIG, CHOLHDL, LDLDIRECT in the last 72 hours. Thyroid Function Tests: No results for input(s): TSH, T4TOTAL, FREET4, T3FREE, THYROIDAB in the last 72 hours. Anemia Panel: No results for input(s): VITAMINB12, FOLATE, FERRITIN, TIBC, IRON, RETICCTPCT in the last 72 hours. Urine analysis:    Component Value Date/Time   COLORURINE YELLOW (A) 01/02/2024 2145   APPEARANCEUR TURBID (A) 01/02/2024 2145   LABSPEC 1.025 01/02/2024 2145   PHURINE 5.0 01/02/2024 2145   GLUCOSEU NEGATIVE 01/02/2024 2145   HGBUR SMALL (A) 01/02/2024 2145   BILIRUBINUR NEGATIVE 01/02/2024 2145   KETONESUR NEGATIVE 01/02/2024 2145   PROTEINUR 30 (A) 01/02/2024 2145   NITRITE NEGATIVE 01/02/2024 2145   LEUKOCYTESUR MODERATE (A) 01/02/2024 2145   Sepsis Labs: @LABRCNTIP (procalcitonin:4,lacticidven:4) )No results found for this or any previous visit (from the past 240 hours).   Radiological Exams on  Admission:   Assessment/Plan Active Problems:   Sepsis secondary to UTI [facility])   Acute renal failure superimposed on stage 3b chronic kidney disease (HCC)   Abnormal EKG   Hypertension   Stroke (HCC)   HLD (hyperlipidemia)   Pressure ulcer   Moderate major depression, single episode (HCC)  Obesity, Class III, BMI 40-49.9 (morbid obesity)   Neck pain   Intertriginous candidiasis   Assessment and Plan:   Sepsis secondary to UTI [facility]): Patient has sepsis with WBC 13.6, initial heart rate 97.  Patient was initially hypotensive with blood pressure 73/63, which improved to 2 SBP 90s after giving 2 L LR.  Patient is still in a high risk of developing hypotension.  -Admitted to PCU as inpatient - Antibiotics: Rocephin 2 g daily (patient received 1 dose of vancomycin and cefepime by ED) - IV fluid: 2 L of LR, 500 cc normal saline, then 125 cc/h - Give Solu-Cortef 100 mg - Midodrine 10 mg 3 times daily - Albumin 25 g - Trend lactic acid level  Acute renal failure superimposed on stage 3b chronic kidney disease (HCC): Likely due to UTI, ATN is also possible due to hypotension. -Hold lisinopril, Lasix - IV fluid as above - Avoid using renal toxic medications  Abnormal EKG: Patient has a diffused ST elevation on EKG he except for lead III.  ED physician discussed with Dr. Florencio of cardiology.  Since patient does not have chest pain, he does not think patient has STEMI.  Initial troponin negative, 16. - Trend troponin - Continue aspirin, Lipitor  Hypertension -Hold blood pressure medications due to hypotension  Stroke (HCC) -Aspirin, Lipitor  HLD (hyperlipidemia) -Lipitor  Pressure ulcer -Wound care consult  Moderate major depression, single episode (HCC) -Continue home medications  Obesity, Class III, BMI 40-49.9 (morbid obesity): Patient has morbid obesity.  Body weight 99.8, BMI 41.59 -Encourage losing weight - Exercise healthy diet  Neck pain: MRI of C-spine on  12/22/2023 showed edema throughout the dens and involving the lateral masses of C1 and C2. Edema within the soft tissues surrounding the dens and adjacent to the bilateral atlantoaxial articulations.  - C-collar - As needed Norco - Tizanidine - Follow-up CT of C-spine  Intertriginous candidiasis: - Continue nystatin powder and ketoconazole cream         DVT ppx: SQ Heparin  Code Status:  DNR (pt has DNR form from SNF)  Family Communication:   Yes, patient's daughter  at bed side.      Disposition Plan:  Anticipate discharge back to previous environment, SNF  Consults called: None  Admission status and Level of care: Progressive:  as inpt          Dispo: The patient is from: SNF              Anticipated d/c is to: SNF              Anticipated d/c date is: 2 days              Patient currently is not medically stable to d/c.    Severity of Illness:  The appropriate patient status for this patient is INPATIENT. Inpatient status is judged to be reasonable and necessary in order to provide the required intensity of service to ensure the patient's safety. The patient's presenting symptoms, physical exam findings, and initial radiographic and laboratory data in the context of their chronic comorbidities is felt to place them at high risk for further clinical deterioration. Furthermore, it is not anticipated that the patient will be medically stable for discharge from the hospital within 2 midnights of admission.   * I certify that at the point of admission it is my clinical judgment that the patient will require inpatient hospital care spanning beyond 2 midnights from the point of admission due  to high intensity of service, high risk for further deterioration and high frequency of surveillance required.*       Date of Service 01/03/2024    Caleb Exon [facility]   If 7PM-7AM, please contact night-coverage [email or web address] 01/03/2024, 2:32 AM

## 2024-01-03 NOTE — Progress Notes (Signed)
 [facility]  -  at [facility]   PATIENT NAME: Doris Curry    MR#:  [id number]  DATE OF BIRTH:  [birth year]  SUBJECTIVE:  no family at bedside during my evaluation. Patient is thirsty morning fluids. She has C collar. She is bedbound comes from [facility]. Has pain in her legs.    VITALS:  Blood pressure (!) 136/96, pulse 62, temperature (!) 97.3 F (36.3 C), temperature source Oral, resp. rate 12, height 5' 1 (1.549 m), SpO2 97%.  PHYSICAL EXAMINATION:   GENERAL:  78 y.o.-year-old patient with no acute distress. Morbid obesity. Chronically ill LUNGS: Normal breath sounds bilaterally decreased breath sounds bases CARDIOVASCULAR: S1, S2 normal. No murmur   ABDOMEN: Soft, nominal obesity EXTREMITIES: foot drop with chronic bilateral   edema b/l.    NEUROLOGIC C collar+  patient is alert and awake right side hemiplegia SKIN: per RN  LABORATORY PANEL:  CBC Recent Labs  Lab 01/03/24 0924  WBC 9.3  HGB 13.6  HCT 47.8*  PLT 118*    Chemistries  Recent Labs  Lab 01/03/24 0807  NA 139  K 4.2  CL 107  CO2 23  GLUCOSE 148*  BUN 50*  CREATININE 1.52*  CALCIUM 9.0   Cardiac Enzymes No results for input(s): TROPONINI in the last 168 hours. RADIOLOGY:  CT CERVICAL SPINE WO CONTRAST Result Date: 01/03/2024 CLINICAL DATA:  Chronic neck pain. MRI of C-spine on 12/22/2023 showed edema throughout the dens in involving the lateral masses of C1 and C2. EXAM: CT CERVICAL SPINE WITHOUT CONTRAST TECHNIQUE: Multidetector CT imaging of the cervical spine was performed without intravenous contrast. Multiplanar CT image reconstructions were also generated. RADIATION DOSE REDUCTION: This exam was performed according to the departmental dose-optimization program which includes automated exposure control, adjustment of the mA and/or kV according to patient size and/or use of iterative reconstruction technique. COMPARISON:  MRI cervical spine 12/22/2023 and CT  11/05/2023 FINDINGS: Alignment: Posterior and rightward subluxation of C1 in relation to the base of the dens. Skull base and vertebrae: Fracture and possible erosions within the dens. Lytic lucencies versus fractures throughout the lateral masses of C1, the occipital condyles, and the basion. No additional fractures. Soft tissues and spinal canal: No definite spinal canal narrowing. Soft tissue prominence about the atlantoaxial joint may be due to pannus formation, hematoma, or infection. Prevertebral soft tissue swelling similar to prior MRI. Disc levels: Multilevel spondylosis, disc space height loss, degenerative endplate changes greatest at C5-C6 and C6-C7 where it is advanced. Upper chest: No acute abnormality. Other: None. IMPRESSION: Type 2 fracture of the dens with posterior and right lateral displacement of the fracture fragment as well as the lateral masses of C1. Irregular lucencies within the dens, lateral masses of C1, occipital condyles, and tip of basion may be due to healing resorption from subacute fractures. However inflammatory or crystalline arthropathy or infection could have this appearance. There is soft tissue prominence about the atlantoaxial joint which may represent pannus formation, hematoma, or infection. No definite spinal canal narrowing. Neurosurgery consult is recommended. Consider further evaluation with MRI cervical spine with IV contrast. Critical Value/emergent results were called by telephone at the time of interpretation on 01/03/2024 at 3:52 am to provider Erminio Cone, who verbally acknowledged these results. Electronically Signed   By: Norman Gatlin M.D.   On: 01/03/2024 04:04   DG Chest Portable 1 View Result Date: 01/02/2024 CLINICAL DATA:  Confusion. EXAM: PORTABLE CHEST 1 VIEW COMPARISON:  07/17/2017 FINDINGS: Stable  heart size and mediastinal contours. No airspace consolidation. No pneumothorax or pleural effusion. Normal pulmonary vasculature. No acute osseous  findings. IMPRESSION: No acute chest findings. Electronically Signed   By: Andrea Gasman M.D.   On: 01/02/2024 22:43    Assessment and Plan Doris Curry is a 78 y.o. female with medical history significant of hypertension, hyperlipidemia, stroke, gout, GERD, depression, CKD-3B, morbid obesity, lymphedema, chronic venous insufficient change in legs, chronic pain, overactive bladder, bedbound, who presents with burning with urination and confusion.   She states that she has neck pain which is managed by [facility]. She had MRI of C-spin on 12/22/23 as outpt.   MRI of C-spine on 12/22/2023 Edema throughout the dens and involving the lateral masses of C1 and C2. Edema within the soft tissues surrounding the dens and adjacent to the bilateral atlantoaxial articulations. Recommend correlation with history of trauma and consider CT of the cervical spine for further evaluation. Alternatively, findings could be present in the setting of inflammatory or crystalline arthropathy versus infection.   Prevertebral edema extending from C2 to the upper thoracic spine.   Degenerative changes as above. No high-grade spinal canal stenosis or cord compression. No high-grade foraminal stenosis.  CT cervical spine  --type 2 fracture of the dens with posterior and right lateral displacement of the fracture fragment as well as the lateral masses of C1.   Irregular lucencies within the dens, lateral masses of C1, occipital condyles, and tip of basion may be due to healing resorption from subacute fractures. However inflammatory or crystalline arthropathy or infection could have this appearance. There is soft tissue prominence about the atlantoaxial joint which may represent pannus formation, hematoma, or infection. No definite spinal canal narrowing. Neurosurgery consult is recommended.   Sepsis secondary to UTI [facility]):  BCID Stap epidermidis--likely contamination --Patient has sepsis with WBC 13.6,  initial heart rate 97.  Patient was initially hypotensive with blood pressure 73/63, which improved to 2 SBP 90s after giving 2 L LR.  Patient is still in a high risk of developing hypotension.  -- Antibiotics: Rocephin 2 g daily + vancomycin - Midodrine --d/c now--bp stabilized. Use prn - lactic acid level 1.1   Acute renal failure superimposed on stage 3b chronic kidney disease (HCC): Likely due to UTI, ATN is also possible due to hypotension. -Hold lisinopril, Lasix - IV fluid as above - Avoid using renal toxic medications   Abnormal EKG: Patient has a diffused ST elevation on EKG he except for lead III.  ED physician discussed with Dr. Florencio of cardiology.  Since patient does not have chest pain, he does not think patient has STEMI.  Initial troponin negative, 16. - Trend troponin--flat - Continue aspirin, Lipitor  Neck pain: MRI of C-spine on 12/22/2023 showed edema throughout the dens and involving the lateral masses of C1 and C2. Edema within the soft tissues surrounding the dens and adjacent to the bilateral atlantoaxial articulations.  - C-collar - As needed Norco - Tizanidine - Follow-up CT of C-spine  as above --Neurosurgery Consulted Dr Gar aware -- looking at records patient was seen in the emergency room early part of May when she slid off the wheelchair and started having neck pain. She was evaluated by [facility] who ordered MRI cervical spine.   Hypertension -Hold blood pressure medications due to hypotension   Stroke (HCC) -Aspirin, Lipitor   HLD (hyperlipidemia) -Lipitor   Pressure ulcer -Wound care consult   Moderate major depression, single episode (HCC) -Continue home medications  Obesity, Class III, BMI 40-49.9 (morbid obesity): Patient has morbid obesity.  Body weight 99.8, BMI 41.59 -Encourage losing weight - Exercise healthy diet   Intertriginous candidiasis: - Continue nystatin powder and ketoconazole cream   Procedures: Family  communication :none today Consults : neurosurgery CODE STATUS: full DVT Prophylaxis : heparin Level of care: Progressive Status is: Inpatient Remains inpatient appropriate because: sepsis    TOTAL TIME TAKING CARE OF THIS PATIENT: 45 minutes.  >50% time spent on counselling and coordination of care  Note: This dictation was prepared with Dragon dictation along with smaller phrase technology. Any transcriptional errors that result from this process are unintentional.  Leita Blanch M.D    [facility]   CC: Primary care physician; [place], [facility]

## 2024-01-03 NOTE — ED Notes (Signed)
 Advised nurse that patient has ready bed

## 2024-01-03 NOTE — ED Notes (Signed)
 Doris Blanch, MD that patient's HR was low in 30's while patient sleeps. Only coming back up when attempting to wake patient. Handed EKGs directly into MD and EDP hands.

## 2024-01-03 NOTE — Progress Notes (Signed)
 Patient admitted from ED d/t Neck pain, burning with urination and confusion. A+Ox2 (self and place). Vital Signs stable. On room air. Wound Consult order in place. Will continue to monitor and assess with POC.

## 2024-01-03 NOTE — Plan of Care (Signed)

## 2024-01-03 NOTE — ED Notes (Signed)
 Lab was called to send phlebotomist for blood draw due to difficult stick and poor blood return from both IV sites.

## 2024-01-03 NOTE — Consult Note (Signed)
 PHARMACY - PHYSICIAN COMMUNICATION CRITICAL VALUE ALERT - BLOOD CULTURE IDENTIFICATION (BCID)  Doris Curry is an 78 y.o. female who presented to [facility] on 01/02/2024 with a chief complaint of Burning with urination, confusion. Overnight NP started vancomycin for concern for CNS involvement lateral masses of C1 and inflammatory or crystalline arthropathy or infection could have this appearance. Pt also have a sacral ulcer.   Assessment:  2 out of 2 bottles growing GPC. BCID positive for staph epi, mecA/C detected.    Name of physician (or Provider) Contacted: Tobie  Current antibiotics: ceftriaxone + vancomycin   Changes to prescribed antibiotics recommended: Discontinue vancomycin if CT scan of spine and sacral ulcer do not have concern for infection. Bcx may be contaminant.  Response not received from provider (Still assessing);  current antibiotics are likely to cover the isolated organism.  Consider de-escalation soon.  Results for orders placed or performed during the hospital encounter of 01/02/24  Blood Culture ID Panel (Reflexed) (Collected: 01/02/2024  9:45 PM)  Result Value Ref Range   Enterococcus faecalis NOT DETECTED NOT DETECTED   Enterococcus Faecium NOT DETECTED NOT DETECTED   Listeria monocytogenes NOT DETECTED NOT DETECTED   Staphylococcus species DETECTED (A) NOT DETECTED   Staphylococcus aureus (BCID) NOT DETECTED NOT DETECTED   Staphylococcus epidermidis DETECTED (A) NOT DETECTED   Staphylococcus lugdunensis NOT DETECTED NOT DETECTED   Streptococcus species NOT DETECTED NOT DETECTED   Streptococcus agalactiae NOT DETECTED NOT DETECTED   Streptococcus pneumoniae NOT DETECTED NOT DETECTED   Streptococcus pyogenes NOT DETECTED NOT DETECTED   A.calcoaceticus-baumannii NOT DETECTED NOT DETECTED   Bacteroides fragilis NOT DETECTED NOT DETECTED   Enterobacterales NOT DETECTED NOT DETECTED   Enterobacter cloacae complex NOT DETECTED NOT DETECTED   Escherichia coli NOT  DETECTED NOT DETECTED   Klebsiella aerogenes NOT DETECTED NOT DETECTED   Klebsiella oxytoca NOT DETECTED NOT DETECTED   Klebsiella pneumoniae NOT DETECTED NOT DETECTED   Proteus species NOT DETECTED NOT DETECTED   Salmonella species NOT DETECTED NOT DETECTED   Serratia marcescens NOT DETECTED NOT DETECTED   Haemophilus influenzae NOT DETECTED NOT DETECTED   Neisseria meningitidis NOT DETECTED NOT DETECTED   Pseudomonas aeruginosa NOT DETECTED NOT DETECTED   Stenotrophomonas maltophilia NOT DETECTED NOT DETECTED   Candida albicans NOT DETECTED NOT DETECTED   Candida auris NOT DETECTED NOT DETECTED   Candida glabrata NOT DETECTED NOT DETECTED   Candida krusei NOT DETECTED NOT DETECTED   Candida parapsilosis NOT DETECTED NOT DETECTED   Candida tropicalis NOT DETECTED NOT DETECTED   Cryptococcus neoformans/gattii NOT DETECTED NOT DETECTED   Methicillin resistance mecA/C DETECTED (A) NOT DETECTED    [name] Tobie 01/03/2024  10:18 AM

## 2024-01-03 NOTE — ED Notes (Signed)
 Per NP, c-collar applied to pt.  Pt tolerated well.

## 2024-01-03 NOTE — Consult Note (Signed)
 Referring Physician:  No referring provider defined for this encounter.  Primary Physician:  [facility]   History of Present Illness: Doris Curry is a 78 y.o. female who presents with the chief complaint of neck pain. She says that she has had neck pain for 'many months'.  She has no new weakness, though she has baseline right sided weakness due to CVA.  78 y.o. female with medical history significant of hypertension, hyperlipidemia, stroke, gout, GERD, depression, CKD-3B, morbid obesity, lymphedema, chronic venous insufficient change in legs, chronic pain, overactive bladder, bedbound and originally presented with burning with urination and confusion.    The symptoms are causing a significant impact on the patient's life.   Review of Systems:  A 10 point review of systems is negative, except for the pertinent positives and negatives detailed in the HPI.  Past Medical History: Past Medical History:  Diagnosis Date   Chronic kidney disease    Depression    Gout, joint    Hyperlipidemia, unspecified    Hypertension    Osteoarthritis of knee 07/14/2012   right   Overactive bladder    Stroke [facility])     Past Surgical History: Past Surgical History:  Procedure Laterality Date   ABDOMINAL HYSTERECTOMY  1987   partial   MASTECTOMY PARTIAL / LUMPECTOMY      Problem List: Patient Active Problem List   Diagnosis Date Noted   Sepsis secondary to UTI (HCC) 01/03/2024   Acute renal failure superimposed on stage 3b chronic kidney disease (HCC) 01/03/2024   HLD (hyperlipidemia) 01/03/2024   Gout 01/03/2024   Abnormal EKG 01/03/2024   Pressure ulcer 01/03/2024   Obesity, Class III, BMI 40-49.9 (morbid obesity) 01/03/2024   Neck pain 01/03/2024   Intertriginous candidiasis 01/03/2024   Sepsis (HCC) 01/03/2024   Moderate major depression, single episode (HCC) 07/17/2017   Chronic pain 07/17/2017   Chronic venous insufficiency 05/07/2017   Lymphedema  05/07/2017   Hypertension    Chronic kidney disease    Hyperlipidemia, unspecified    Stroke (HCC)     Allergies: Allergies as of 01/02/2024 - Review Complete 01/02/2024  Allergen Reaction Noted   Citalopram  04/05/2014   Bupropion Anxiety 05/05/2014    Medications:   Social History: Social History   Tobacco Use   Smoking status: Former    Current packs/day: 0.00    Average packs/day: 1.5 packs/day for 20.0 years (30.0 ttl pk-yrs)    Types: Cigarettes    Start date: 04/23/1982    Quit date: 04/23/2002    Years since quitting: 21.7   Smokeless tobacco: Never  Vaping Use   Vaping status: Never Used  Substance Use Topics   Alcohol use: No   Drug use: No    Family Medical History: Family History  Problem Relation Age of Onset   Breast cancer Mother    Alzheimer's disease Mother    Hypertension Father    Kidney disease Father    Stroke Father    Stroke Paternal Grandfather    Diabetes type II Paternal Grandfather    Diabetes type II Paternal Aunt    Diabetes type II Daughter     Physical Examination: @VITALWITHPAIN @  General: Patient is well developed, well nourished, calm, collected, and in no apparent distress.  Psychiatric: Patient is non-anxious.  Head:  Pupils equal, round, and reactive to light.  ENT:  Oral mucosa appears well hydrated.  Neck:   Supple.  Full range of motion.  Respiratory: Patient is breathing  without any difficulty.  Extremities: No edema.  Vascular: Palpable pulses.  Skin:   On exposed skin, there are no abnormal skin lesions.  NEUROLOGICAL:  General: In no acute distress.   Awake, alert, oriented to person, place, and time.  Pupils equal round and reactive to light.  Facial tone is symmetric.  Tongue protrusion is midline.  There is no pronator drift.    Strength: Side Biceps Triceps Deltoid Interossei Grip Wrist Ext. Wrist Flex.  R 1 1 1 1 1 1 5   L 5 5 5 5 5 5 5    Side Iliopsoas Quads Hamstring PF DF EHL  R 1 1 1  1 1 1   L 5 5 5 5 5 5    Imaging: CT:  Type 2 fracture of the dens with posterior and right lateral displacement of the fracture fragment as well as the lateral masses of C1.   Irregular lucencies within the dens, lateral masses of C1, occipital condyles, and tip of basion may be due to healing resorption from subacute fractures. However inflammatory or crystalline arthropathy or infection could have this appearance. There is soft tissue prominence about the atlantoaxial joint which may represent pannus formation, hematoma, or infection. No definite spinal canal narrowing. Neurosurgery consult is recommended. Consider further evaluation with MRI cervical spine with IV contrast.   MRI: Edema throughout the dens and involving the lateral masses of C1 and C2. Edema within the soft tissues surrounding the dens and adjacent to the bilateral atlantoaxial articulations. Recommend correlation with history of trauma and consider CT of the cervical spine for further evaluation. Alternatively, findings could be present in the setting of inflammatory or crystalline arthropathy versus infection.   Prevertebral edema extending from C2 to the upper thoracic spine.   Degenerative changes as above. No high-grade spinal canal stenosis or cord compression. No high-grade foraminal stenosis.      I have personally reviewed the images and agree with the above interpretation.  Labs: UA:  many bacteria, WBC > 50 WBC = 13.6  Assessment and Plan: Doris Curry is a pleasant 78 y.o. female with many months of neck pain.  No new neurological deficits.  I have discussed the condition with the patient, including showing the radiographs and discussing treatment options in layman's terms.  The patient may benefit from conservative management.  Thus, I have recommended the following:   1) No acute surgical interventions warranted 2)  Patient will need a small [place] J collar.  Current collar is too large. 3)   Follow up urine cultures.  This is likely urosepsis that has spread to vertebra.  Based on the sensitivities, that can guide antibiotic treatment.   I will see the patient back in a few weeks to gauge progress.  Thank you for involving me in the care of this patient. I will keep you apprised of the patient's progress.   This note was partially dictated using voice recognition software, so please excuse any errors that were not corrected.   Deatrice Figures MD, PhD

## 2024-01-03 NOTE — Consult Note (Signed)
 WOC Nurse Consult Note: Reason for Consult: sacral wound and heel wound  Image reviewed; buttock DTPI. Notified staff no picture of the heel/heels Wound type: Buttock Deep Tissue Pressure Injury Pressure Injury POA: Yes Measurement: see nursing flow sheet Wound bed: superficial skin loss; dark purple non blanchable tissue Drainage (amount, consistency, odor) see nursing flow sheets Periwound: moist Dressing procedure/placement/frequency: Cleanse wound with saline, pat dry Cover with single layer of xeroform, top with foam Turn and reposition per hospital policy  Heel pictures requested.    Re consult if needed, will not follow at this time. Thanks  Zakariah Urwin [employer], RN,CWOCN, CNS, CWON-AP [phone])

## 2024-01-03 NOTE — ED Notes (Signed)
 Warm blankets applied to patient and heat in room turned up.

## 2024-01-03 NOTE — ED Notes (Signed)
 Lab in room states that they sent down 2nd set of cultures from previous draw.

## 2024-01-03 NOTE — Progress Notes (Signed)
 Pharmacy Antibiotic Note  Doris Curry is a 77 y.o. female admitted on 01/02/2024 with possible discitis.  Pharmacy has been consulted for Vancomycin dosing.  Plan: Pt given Vancomycin 1000 mg once. Ordered additional 1500 mg per pt wt of 99.8 kg charted 11/05/23 Vancomycin 500 mg IV Q 24 hrs. Goal AUC 400-550. Expected AUC: 521.4 SCr used: 1.98, Vd used: 0.5, BMI: 41.2  Pharmacy will continue to follow and will adjust abx dosing whenever warranted.  Temp (24hrs), Avg:97.6 F (36.4 C), Min:97.5 F (36.4 C), Max:97.7 F (36.5 C)   Recent Labs  Lab 01/02/24 2120  WBC 13.6*  CREATININE 1.96*    CrCl cannot be calculated (Unknown ideal weight.).    Allergies  Allergen Reactions   Citalopram     Other reaction(s): Blood Disorder Easy bruising   Bupropion Anxiety    150 mg.  Has tolerated 75    Antimicrobials this admission: 6/28 Cefepime >> x 1 dose 6/28 Vancomycin >>  6/29 Ceftriaxone >>  Microbiology results: 6/28 BCx: Pending 6/28 UCx: Pending   Thank you for allowing pharmacy to be a part of this patient's care.  Rankin CANDIE Dills, PharmD, [facility] 01/03/2024 6:24 AM

## 2024-01-03 NOTE — ED Notes (Signed)
 Warm blankets applied

## 2024-01-03 NOTE — Significant Event (Signed)
       CROSS COVER NOTE  NAME: Satsuki Zillmer MRN: [id number] DOB : Nov 11, 1945 ATTENDING PHYSICIAN: Niu, Xilin, MD    Date of Service   01/03/2024   HPI/Events of Note   Called from [facility] radiology regarding CT results IMPRESSION: Type 2 fracture of the dens with posterior and right lateral displacement of the fracture fragment as well as the lateral masses of C1.   Irregular lucencies within the dens, lateral masses of C1, occipital condyles, and tip of basion may be due to healing resorption from subacute fractures. However inflammatory or crystalline arthropathy or infection could have this appearance. There is soft tissue prominence about the atlantoaxial joint which may represent pannus formation, hematoma, or infection. No definite spinal canal narrowing. Neurosurgery consult is recommended. Consider further evaluation with MRI cervical spine with IV contrast.   Critical Value/emergent results were called by telephone at the time of interpretation on 01/03/2024 at 3:52 am to provider Erminio Cone, who verbally acknowledged these results.   HPI  significant of hypertension, hyperlipidemia, stroke, gout, GERD, depression, CKD-3B, morbid obesity, lymphedema, chronic venous insufficient change in legs, chronic pain, overactive bladder, bedbound, who presents with burning with urination and confusion.  UA with bacteria and leukocytes but no nitrite - culture is pending    Interventions   Assessment/Plan:  Daughter at bedside states patient pain had increased but was being treated with more medication and thought it may be contributing to her sleepiness and confusion Patient  resting comfortably VSS CT results discussed with daughter Place aspen C collar 10 mg decadron x1 Neurosurgery consulted/discussed via chat Dr Melonie who will see  Vancomycin per pharmacy dosing consult added to regimen MRSA screen        Erminio LITTIE Cone NP [facility] Cross Cover 7pm-7am - check amion for availability Pager [phone]

## 2024-01-04 ENCOUNTER — Encounter: Payer: Self-pay | Admitting: Internal Medicine

## 2024-01-04 DIAGNOSIS — D72829 Elevated white blood cell count, unspecified: Secondary | ICD-10-CM

## 2024-01-04 DIAGNOSIS — R6520 Severe sepsis without septic shock: Secondary | ICD-10-CM

## 2024-01-04 DIAGNOSIS — N390 Urinary tract infection, site not specified: Secondary | ICD-10-CM | POA: Diagnosis not present

## 2024-01-04 DIAGNOSIS — N179 Acute kidney failure, unspecified: Secondary | ICD-10-CM | POA: Diagnosis not present

## 2024-01-04 DIAGNOSIS — A419 Sepsis, unspecified organism: Secondary | ICD-10-CM | POA: Diagnosis not present

## 2024-01-04 LAB — GLUCOSE, CAPILLARY
Glucose-Capillary: 119 mg/dL — ABNORMAL HIGH (ref 70–99)
Glucose-Capillary: 87 mg/dL (ref 70–99)

## 2024-01-04 LAB — CREATININE, SERUM
Creatinine, Ser: 1.22 mg/dL — ABNORMAL HIGH (ref 0.44–1.00)
GFR, Estimated: 45 mL/min — ABNORMAL LOW (ref >60)

## 2024-01-04 LAB — HEMOGLOBIN A1C
Hgb A1c MFr Bld: 6 % — ABNORMAL HIGH (ref 4.8–5.6)
Mean Plasma Glucose: 125.5 mg/dL

## 2024-01-04 MED ORDER — LIDOCAINE 5 % EX PTCH
1.0000 | MEDICATED_PATCH | CUTANEOUS | Status: DC
  Administered 2024-01-04: 1 via TRANSDERMAL
  Filled 2024-01-04: qty 1

## 2024-01-04 MED ORDER — ADULT MULTIVITAMIN W/MINERALS CH
1.0000 | ORAL_TABLET | Freq: Every day | ORAL | Status: DC
  Administered 2024-01-04 – 2024-01-05 (×2): 1
  Filled 2024-01-04 (×2): qty 1

## 2024-01-04 MED ORDER — TRAMADOL HCL 50 MG PO TABS: 50.0000 mg | ORAL_TABLET | Freq: Four times a day (QID) | ORAL | Status: DC | PRN

## 2024-01-04 MED ORDER — INSULIN ASPART 100 UNIT/ML IJ SOLN
0.0000 [IU] | Freq: Three times a day (TID) | INTRAMUSCULAR | Status: DC
  Filled 2024-01-04: qty 1

## 2024-01-04 MED ORDER — PRAVASTATIN SODIUM 20 MG PO TABS: 40.0000 mg | ORAL_TABLET | Freq: Every day | ORAL | Status: DC

## 2024-01-04 MED ORDER — LISINOPRIL 20 MG PO TABS
20.0000 mg | ORAL_TABLET | Freq: Every day | ORAL | Status: DC
  Administered 2024-01-04 – 2024-01-05 (×2): 20 mg via ORAL
  Filled 2024-01-04 (×2): qty 1

## 2024-01-04 MED ORDER — HYDROCODONE-ACETAMINOPHEN 5-325 MG PO TABS
1.0000 | ORAL_TABLET | Freq: Four times a day (QID) | ORAL | Status: DC | PRN
  Administered 2024-01-04: 1 via ORAL
  Filled 2024-01-04: qty 1

## 2024-01-04 MED ORDER — METFORMIN HCL 500 MG PO TABS
500.0000 mg | ORAL_TABLET | Freq: Two times a day (BID) | ORAL | Status: DC
  Administered 2024-01-04 – 2024-01-05 (×2): 500 mg via ORAL
  Filled 2024-01-04 (×2): qty 1

## 2024-01-04 MED ORDER — MORPHINE SULFATE (PF) 2 MG/ML IV SOLN
2.0000 mg | INTRAVENOUS | Status: DC | PRN
  Administered 2024-01-04: 2 mg via INTRAVENOUS
  Filled 2024-01-04: qty 1

## 2024-01-04 MED ORDER — VITAMIN C 500 MG PO TABS
500.0000 mg | ORAL_TABLET | Freq: Two times a day (BID) | ORAL | Status: DC
  Administered 2024-01-04 – 2024-01-05 (×3): 500 mg via ORAL
  Filled 2024-01-04 (×3): qty 1

## 2024-01-04 MED ORDER — DICLOFENAC SODIUM 1 % EX GEL
2.0000 g | Freq: Two times a day (BID) | CUTANEOUS | Status: DC | PRN
  Filled 2024-01-04: qty 100

## 2024-01-04 MED ORDER — ENSURE PLUS HIGH PROTEIN PO LIQD
237.0000 mL | Freq: Two times a day (BID) | ORAL | Status: DC
  Administered 2024-01-04 – 2024-01-05 (×4): 237 mL via ORAL

## 2024-01-04 MED ORDER — ZINC SULFATE 220 (50 ZN) MG PO CAPS
220.0000 mg | ORAL_CAPSULE | Freq: Every day | ORAL | Status: DC
  Administered 2024-01-04 – 2024-01-05 (×2): 220 mg via ORAL
  Filled 2024-01-04 (×2): qty 1

## 2024-01-04 MED ORDER — INSULIN ASPART 100 UNIT/ML IJ SOLN: 0.0000 [IU] | Freq: Every day | INTRAMUSCULAR | Status: DC

## 2024-01-04 NOTE — Progress Notes (Signed)
 [facility]  - [place] at [facility]   PATIENT NAME: Doris Curry    MR#:  [id number]  DATE OF BIRTH:  1945/07/09  SUBJECTIVE:  no family at bedside during my evaluation.  She is bedbound comes from [facility]. Has pain in her legs. Patient complaining of neck pain.  [place] collar placed. No new neuro- deficit. Patient reports of neck pain has been on and off for a long time.   VITALS:  Blood pressure 120/79, pulse 88, temperature 97.6 F (36.4 C), temperature source Oral, resp. rate 16, height 5' 1 (1.549 m), weight 99.8 kg, SpO2 99%.  PHYSICAL EXAMINATION:   GENERAL:  78 y.o.-year-old patient with no acute distress. Morbid obesity. Chronically ill LUNGS: Normal breath sounds bilaterally decreased breath sounds bases CARDIOVASCULAR: S1, S2 normal. No murmur   ABDOMEN: Soft, nominal obesity EXTREMITIES: foot drop with chronic bilateral   edema b/l.    NEUROLOGIC C collar+  patient is alert and awake right side hemiplegia SKIN: per RN  LABORATORY PANEL:  CBC Recent Labs  Lab 01/03/24 0924  WBC 9.3  HGB 13.6  HCT 47.8*  PLT 118*    Chemistries  Recent Labs  Lab 01/03/24 0807 01/04/24 1047  NA 139  --   K 4.2  --   CL 107  --   CO2 23  --   GLUCOSE 148*  --   BUN 50*  --   CREATININE 1.52* 1.22*  CALCIUM 9.0  --    Cardiac Enzymes No results for input(s): TROPONINI in the last 168 hours. RADIOLOGY:  CT CERVICAL SPINE WO CONTRAST Result Date: 01/03/2024 CLINICAL DATA:  Chronic neck pain. MRI of C-spine on 12/22/2023 showed edema throughout the dens in involving the lateral masses of C1 and C2. EXAM: CT CERVICAL SPINE WITHOUT CONTRAST TECHNIQUE: Multidetector CT imaging of the cervical spine was performed without intravenous contrast. Multiplanar CT image reconstructions were also generated. RADIATION DOSE REDUCTION: This exam was performed according to the departmental dose-optimization program which includes automated exposure control,  adjustment of the mA and/or kV according to patient size and/or use of iterative reconstruction technique. COMPARISON:  MRI cervical spine 12/22/2023 and CT 11/05/2023 FINDINGS: Alignment: Posterior and rightward subluxation of C1 in relation to the base of the dens. Skull base and vertebrae: Fracture and possible erosions within the dens. Lytic lucencies versus fractures throughout the lateral masses of C1, the occipital condyles, and the basion. No additional fractures. Soft tissues and spinal canal: No definite spinal canal narrowing. Soft tissue prominence about the atlantoaxial joint may be due to pannus formation, hematoma, or infection. Prevertebral soft tissue swelling similar to prior MRI. Disc levels: Multilevel spondylosis, disc space height loss, degenerative endplate changes greatest at C5-C6 and C6-C7 where it is advanced. Upper chest: No acute abnormality. Other: None. IMPRESSION: Type 2 fracture of the dens with posterior and right lateral displacement of the fracture fragment as well as the lateral masses of C1. Irregular lucencies within the dens, lateral masses of C1, occipital condyles, and tip of basion may be due to healing resorption from subacute fractures. However inflammatory or crystalline arthropathy or infection could have this appearance. There is soft tissue prominence about the atlantoaxial joint which may represent pannus formation, hematoma, or infection. No definite spinal canal narrowing. Neurosurgery consult is recommended. Consider further evaluation with MRI cervical spine with IV contrast. Critical Value/emergent results were called by telephone at the time of interpretation on 01/03/2024 at 3:52 am to provider Erminio Cone,  who verbally acknowledged these results. Electronically Signed   By: Norman Gatlin M.D.   On: 01/03/2024 04:04   DG Chest Portable 1 View Result Date: 01/02/2024 CLINICAL DATA:  Confusion. EXAM: PORTABLE CHEST 1 VIEW COMPARISON:  07/17/2017  FINDINGS: Stable heart size and mediastinal contours. No airspace consolidation. No pneumothorax or pleural effusion. Normal pulmonary vasculature. No acute osseous findings. IMPRESSION: No acute chest findings. Electronically Signed   By: Andrea Gasman M.D.   On: 01/02/2024 22:43    Assessment and Plan Doris Curry is a 78 y.o. female with medical history significant of hypertension, hyperlipidemia, stroke, gout, GERD, depression, CKD-3B, morbid obesity, lymphedema, chronic venous insufficient change in legs, chronic pain, overactive bladder, bedbound, who presents with burning with urination and confusion.   She states that she has neck pain which is managed by [facility]. She had MRI of C-spin on 12/22/23 as outpt.   MRI of C-spine on 12/22/2023 Edema throughout the dens and involving the lateral masses of C1 and C2. Edema within the soft tissues surrounding the dens and adjacent to the bilateral atlantoaxial articulations. Recommend correlation with history of trauma and consider CT of the cervical spine for further evaluation. Alternatively, findings could be present in the setting of inflammatory or crystalline arthropathy versus infection.   Prevertebral edema extending from C2 to the upper thoracic spine.   Degenerative changes as above. No high-grade spinal canal stenosis or cord compression. No high-grade foraminal stenosis.  CT cervical spine  --type 2 fracture of the dens with posterior and right lateral displacement of the fracture fragment as well as the lateral masses of C1.   Irregular lucencies within the dens, lateral masses of C1, occipital condyles, and tip of basion may be due to healing resorption from subacute fractures. However inflammatory or crystalline arthropathy or infection could have this appearance. There is soft tissue prominence about the atlantoaxial joint which may represent pannus formation, hematoma, or infection. No definite spinal  canal narrowing. Neurosurgery consult is recommended.   Sepsis secondary to UTI [facility]):  BCID Stap epidermidis--likely contamination --Patient has sepsis with WBC 13.6, initial heart rate 97.  Patient was initially hypotensive with blood pressure 73/63, which improved to 2 SBP 90s after giving 2 L LR.  Patient is still in a high risk of developing hypotension.  -- Antibiotics: Rocephin 2 g daily + vancomycin - Midodrine --d/c now--bp stabilized. Use prn - lactic acid level 1.1 -- DC vancomycin. -- Urine culture Klebsiella-- will switch to oral tomorrow once urine culture insensitivities received --wbc 9.9   Acute renal failure superimposed on stage 3b chronic kidney disease (HCC): Likely due to UTI, ATN is also possible due to hypotension. -Hold lisinopril, Lasix - received IV fluid as above - Avoid using renal toxic medications   Abnormal EKG: Patient has a diffused ST elevation on EKG he except for lead III.  ED physician discussed with Dr. Florencio of cardiology.  Since patient does not have chest pain, he does not think patient has STEMI.  Initial troponin negative, 16. - Trend troponin--flat - Continue aspirin, Lipitor  Neck pain: MRI of C-spine on 12/22/2023 showed edema throughout the dens and involving the lateral masses of C1 and C2. Edema within the soft tissues surrounding the dens and adjacent to the bilateral atlantoaxial articulations.  - C-collar - As needed Norco - Tizanidine - Follow-up CT of C-spine  as above --Neurosurgery Consulted Dr Gar -- conservative management, neck collar, PRN pain meds, follow-up as outpatient -- looking at  records patient was seen in the emergency room early part of May when she slid off the wheelchair and started having neck pain. She was evaluated by [facility] who ordered MRI cervical spine.   Hypertension -- blood pressure improved. Resume home BP meds  Type II diabetes well-controlled -- resume metformin -- last A1c per  outpatient lab 6.8   Stroke (HCC) -Aspirin, Lipitor   HLD (hyperlipidemia) -Lipitor   Pressure ulcer -Wound care consult noted   Moderate major depression, single episode (HCC) -Continue home medications   Obesity, Class III, BMI 40-49.9 (morbid obesity): Patient has morbid obesity.  Body weight 99.8, BMI 41.59 -Encourage losing weight - Exercise healthy diet   Intertriginous candidiasis: - Continue nystatin powder and ketoconazole cream   Procedures: Family communication :none today Consults : neurosurgery CODE STATUS: full DVT Prophylaxis : heparin Level of care: Telemetry Medical Status is: Inpatient Remains inpatient appropriate because: sepsis    TOTAL TIME TAKING CARE OF THIS PATIENT:35 minutes.  >50% time spent on counselling and coordination of care  Note: This dictation was prepared with Dragon dictation along with smaller phrase technology. Any transcriptional errors that result from this process are unintentional.  Leita Blanch M.D    [facility]   CC: Primary care physician; [place], [facility]

## 2024-01-04 NOTE — Consult Note (Addendum)
 WOC Nurse wound follow up  WOC consult was previously performed remotely on 6/29 by Delaine Skates, and topical treatment orders have been provided for bedside nurses to perform for buttock.   Requested to assess bilat heels in person.  Left heel with intact skin.   Right heel with dark red purple Deep tissue pressure injury, 4X3cm, present on admission.  Dressing procedure/placement/frequency: Topical treatment orders provided for bedside nurses to perform as follows: Float heels to reduce pressure. Foam dressing to right heel, change Q 3 days or PRN soiling Please re-consult if further assistance is needed.  Thank-you,  Stephane Fought MSN, RN, CWOCN, [place], CNS [phone]

## 2024-01-04 NOTE — TOC Initial Note (Signed)
 Transition of Care [facility]) - Initial/Assessment Note    Patient Details  Name: Doris Curry MRN: [id number] Date of Birth: February 03, 1946  Transition of Care [facility]) CM/SW Contact:    Quintella Suzen Jansky, RN Phone Number: 01/04/2024, 11:19 AM  Clinical Narrative:                  Per chart review patient resides at [facility]. TOC will continue to follow for discharge needs. Expected Discharge Plan: Long Term Nursing Home Barriers to Discharge: Continued Medical Work up   Patient Goals and CMS Choice            Expected Discharge Plan and Services     Post Acute Care Choice: Skilled Nursing Facility Living arrangements for the past 2 months: Skilled Nursing Facility                   DME Agency: NA       HH Arranged: NA          Prior Living Arrangements/Services Living arrangements for the past 2 months: Skilled Nursing Facility Lives with:: Facility Resident Patient language and need for interpreter reviewed:: No Do you feel safe going back to the place where you live?: Yes      Need for Family Participation in Patient Care: Yes (Comment) Care giver support system in place?: Yes (comment)   Criminal Activity/Legal Involvement Pertinent to Current Situation/Hospitalization: No - Comment as needed  Activities of Daily Living   ADL Screening (condition at time of admission) Independently performs ADLs?: No Does the patient have a NEW difficulty with bathing/dressing/toileting/self-feeding that is expected to last >3 days?: No Does the patient have a NEW difficulty with getting in/out of bed, walking, or climbing stairs that is expected to last >3 days?: No Does the patient have a NEW difficulty with communication that is expected to last >3 days?: No Is the patient deaf or have difficulty hearing?: No Does the patient have difficulty seeing, even when wearing glasses/contacts?: No Does the patient have difficulty concentrating, remembering, or making  decisions?: No  Permission Sought/Granted                  Emotional Assessment Appearance:: Appears stated age       Alcohol / Substance Use: Not Applicable Psych Involvement: No (comment)  Admission diagnosis:  Lower urinary tract infectious disease [N39.0] Confusion [R41.0] AKI (acute kidney injury) (HCC) [N17.9] Sepsis (HCC) [A41.9] Leukocytosis, unspecified type [D72.829] Sepsis due to gram-negative UTI (HCC) [A41.50, N39.0] Patient Active Problem List   Diagnosis Date Noted   Sepsis secondary to UTI (HCC) 01/03/2024   Acute renal failure superimposed on stage 3b chronic kidney disease (HCC) 01/03/2024   HLD (hyperlipidemia) 01/03/2024   Gout 01/03/2024   Abnormal EKG 01/03/2024   Pressure ulcer 01/03/2024   Obesity, Class III, BMI 40-49.9 (morbid obesity) 01/03/2024   Neck pain 01/03/2024   Intertriginous candidiasis 01/03/2024   Sepsis (HCC) 01/03/2024   Moderate major depression, single episode (HCC) 07/17/2017   Chronic pain 07/17/2017   Chronic venous insufficiency 05/07/2017   Lymphedema 05/07/2017   Hypertension    Chronic kidney disease    Hyperlipidemia, unspecified    Stroke (HCC)    PCP:  [facility] Pharmacy:  No Pharmacies Listed    Social Drivers of Health (SDOH) Social History: SDOH Screenings   Food Insecurity: No Food Insecurity (01/03/2024)  Housing: Low Risk  (01/03/2024)  Transportation Needs: No Transportation Needs (01/03/2024)  Utilities: Not  At Risk (01/03/2024)  Social Connections: Patient Declined (01/03/2024)  Tobacco Use: Medium Risk (01/04/2024)   SDOH Interventions:     Readmission Risk Interventions     No data to display

## 2024-01-04 NOTE — Plan of Care (Signed)
  Problem: Education: Goal: Knowledge of General Education information will improve Description: Including pain rating scale, medication(s)/side effects and non-pharmacologic comfort measures 01/04/2024 0002 by Lennie Rodgers BIRCH, RN Outcome: Progressing 01/03/2024 2053 by Lennie Rodgers BIRCH, RN Outcome: Progressing   Problem: Health Behavior/Discharge Planning: Goal: Ability to manage health-related needs will improve 01/04/2024 0002 by Lennie Rodgers BIRCH, RN Outcome: Progressing 01/03/2024 2053 by Lennie Rodgers BIRCH, RN Outcome: Progressing   Problem: Clinical Measurements: Goal: Ability to maintain clinical measurements within normal limits will improve 01/04/2024 0002 by Lennie Rodgers BIRCH, RN Outcome: Progressing 01/03/2024 2053 by Lennie Rodgers BIRCH, RN Outcome: Progressing Goal: Will remain free from infection 01/04/2024 0002 by Lennie Rodgers BIRCH, RN Outcome: Progressing 01/03/2024 2053 by Lennie Rodgers BIRCH, RN Outcome: Progressing Goal: Diagnostic test results will improve 01/04/2024 0002 by Lennie Rodgers BIRCH, RN Outcome: Progressing 01/03/2024 2053 by Lennie Rodgers BIRCH, RN Outcome: Progressing Goal: Respiratory complications will improve 01/04/2024 0002 by Lennie Rodgers BIRCH, RN Outcome: Progressing 01/03/2024 2053 by Lennie Rodgers BIRCH, RN Outcome: Progressing Goal: Cardiovascular complication will be avoided 01/04/2024 0002 by Lennie Rodgers BIRCH, RN Outcome: Progressing 01/03/2024 2053 by Lennie Rodgers BIRCH, RN Outcome: Progressing   Problem: Activity: Goal: Risk for activity intolerance will decrease 01/04/2024 0002 by Lennie Rodgers BIRCH, RN Outcome: Progressing 01/03/2024 2053 by Lennie Rodgers BIRCH, RN Outcome: Progressing   Problem: Nutrition: Goal: Adequate nutrition will be maintained 01/04/2024 0002 by Lennie Rodgers BIRCH, RN Outcome: Progressing 01/03/2024 2053 by Lennie Rodgers BIRCH, RN Outcome: Progressing   Problem: Coping: Goal: Level of anxiety will decrease 01/04/2024 0002 by Lennie Rodgers BIRCH, RN Outcome: Progressing 01/03/2024 2053 by Lennie Rodgers BIRCH, RN Outcome: Progressing   Problem: Elimination: Goal: Will not experience complications related to bowel motility 01/04/2024 0002 by Lennie Rodgers BIRCH, RN Outcome: Progressing 01/03/2024 2053 by Lennie Rodgers BIRCH, RN Outcome: Progressing Goal: Will not experience complications related to urinary retention 01/04/2024 0002 by Lennie Rodgers BIRCH, RN Outcome: Progressing 01/03/2024 2053 by Lennie Rodgers BIRCH, RN Outcome: Progressing   Problem: Pain Managment: Goal: General experience of comfort will improve and/or be controlled 01/04/2024 0002 by Lennie Rodgers BIRCH, RN Outcome: Progressing 01/03/2024 2053 by Lennie Rodgers BIRCH, RN Outcome: Progressing   Problem: Safety: Goal: Ability to remain free from injury will improve 01/04/2024 0002 by Lennie Rodgers BIRCH, RN Outcome: Progressing 01/03/2024 2053 by Lennie Rodgers BIRCH, RN Outcome: Progressing   Problem: Skin Integrity: Goal: Risk for impaired skin integrity will decrease 01/04/2024 0002 by Lennie Rodgers BIRCH, RN Outcome: Progressing 01/03/2024 2053 by Lennie Rodgers BIRCH, RN Outcome: Progressing

## 2024-01-04 NOTE — Plan of Care (Signed)
   Problem: Health Behavior/Discharge Planning: Goal: Ability to manage health-related needs will improve Outcome: Progressing   Problem: Nutrition: Goal: Adequate nutrition will be maintained Outcome: Progressing

## 2024-01-04 NOTE — NC FL2 (Signed)
 [place]  MEDICAID FL2 LEVEL OF CARE FORM     IDENTIFICATION  Patient Name: Doris Curry Birthdate: Feb 13, 1946 Sex: female Admission Date (Current Location): 01/02/2024  [facility] and [place] Number:  Chiropodist and Address:  [facility], [address] [place], [place] [address]      Provider Number: [id number]  Attending Physician Name and Address:  Tobie Calix, MD  Relative Name and Phone Number:  Doris Curry  Daughter  Emergency Contact  [phone]    Current Level of Care: Hospital Recommended Level of Care: Nursing Facility Prior Approval Number:    Date Approved/Denied:   PASRR Number: [id number]  Discharge Plan: SNF    Current Diagnoses: Patient Active Problem List   Diagnosis Date Noted   Leukocytosis 01/04/2024   Lower urinary tract infectious disease 01/04/2024   Sepsis secondary to UTI (HCC) 01/03/2024   AKI (acute kidney injury) (HCC) 01/03/2024   HLD (hyperlipidemia) 01/03/2024   Gout 01/03/2024   Abnormal EKG 01/03/2024   Pressure ulcer 01/03/2024   Obesity, Class III, BMI 40-49.9 (morbid obesity) 01/03/2024   Neck pain 01/03/2024   Intertriginous candidiasis 01/03/2024   Sepsis (HCC) 01/03/2024   Moderate major depression, single episode (HCC) 07/17/2017   Chronic pain 07/17/2017   Chronic venous insufficiency 05/07/2017   Lymphedema 05/07/2017   Hypertension    Chronic kidney disease    Hyperlipidemia, unspecified    Stroke (HCC)     Orientation RESPIRATION BLADDER Height & Weight     Self, Time, Situation, Place  Normal Incontinent Weight: 99.8 kg Height:  5' 1 (154.9 cm)  BEHAVIORAL SYMPTOMS/MOOD NEUROLOGICAL BOWEL NUTRITION STATUS      Incontinent Diet (Regular diet, thin liquid)  AMBULATORY STATUS COMMUNICATION OF NEEDS Skin   Extensive Assist Verbally Normal                       Personal Care Assistance Level of Assistance  Bathing, Feeding, Dressing, Total care Bathing Assistance:  Maximum assistance Feeding assistance: Maximum assistance Dressing Assistance: Maximum assistance Total Care Assistance: Maximum assistance   Functional Limitations Info             SPECIAL CARE FACTORS FREQUENCY                       Contractures Contractures Info: Not present    Additional Factors Info  Code Status, Allergies Code Status Info: Full Code Allergies Info: Citalopram, Bupropion           Current Medications (01/04/2024):  This is the current hospital active medication list Current Facility-Administered Medications  Medication Dose Route Frequency Provider Last Rate Last Admin   acetaminophen (TYLENOL) tablet 650 mg  650 mg Oral Q6H PRN Niu, Xilin, MD       ascorbic acid (VITAMIN C) tablet 500 mg  500 mg Oral BID Patel, Sona, MD   500 mg at 01/04/24 1008   aspirin EC tablet 81 mg  81 mg Oral Daily Niu, Xilin, MD   81 mg at 01/04/24 1008   atorvastatin (LIPITOR) tablet 40 mg  40 mg Oral Daily Niu, Xilin, MD   40 mg at 01/04/24 1008   cefTRIAXone (ROCEPHIN) 2 g in sodium chloride  0.9 % 100 mL IVPB  2 g Intravenous Q24H Niu, Xilin, MD 200 mL/hr at 01/04/24 1231 2 g at 01/04/24 1231   chlorhexidine (PERIDEX) 0.12 % solution 10 mL  10 mL Mouth Rinse QID PRN Niu, Xilin, MD  diclofenac Sodium (VOLTAREN) 1 % topical gel 2 g  2 g Topical BID PRN Patel, Sona, MD       DULoxetine  (CYMBALTA ) DR capsule 30 mg  30 mg Oral BID Niu, Xilin, MD   30 mg at 01/04/24 1008   feeding supplement (ENSURE PLUS HIGH PROTEIN) liquid 237 mL  237 mL Oral BID BM Patel, Sona, MD   237 mL at 01/04/24 1458   fluticasone  (FLONASE ) 50 MCG/ACT nasal spray 2 spray  2 spray Each Nare Daily Niu, Xilin, MD   2 spray at 01/04/24 1018   gabapentin (NEURONTIN) capsule 300 mg  300 mg Oral TID Niu, Xilin, MD   300 mg at 01/04/24 1008   guaiFENesin (MUCINEX) 12 hr tablet 600 mg  600 mg Oral BID PRN Niu, Xilin, MD       heparin injection 5,000 Units  5,000 Units Subcutaneous Q8H Niu, Xilin, MD    5,000 Units at 01/04/24 1457   HYDROcodone-acetaminophen (NORCO/VICODIN) 5-325 MG per tablet 1 tablet  1 tablet Oral Q6H PRN Patel, Sona, MD   1 tablet at 01/04/24 1036   insulin aspart (novoLOG) injection 0-5 Units  0-5 Units Subcutaneous QHS Patel, Sona, MD       insulin aspart (novoLOG) injection 0-9 Units  0-9 Units Subcutaneous TID WC Patel, Sona, MD       ketoconazole (NIZORAL) 2 % cream 1 Application  1 Application Topical Daily Niu, Xilin, MD       lidocaine  (LIDODERM ) 5 % 1 patch  1 patch Transdermal Q24H Patel, Sona, MD       lisinopril (ZESTRIL) tablet 20 mg  20 mg Oral Daily Patel, Sona, MD       loratadine (CLARITIN) tablet 10 mg  10 mg Oral Daily Niu, Xilin, MD   10 mg at 01/04/24 1008   metFORMIN (GLUCOPHAGE) tablet 500 mg  500 mg Oral BID WC Patel, Sona, MD       morphine (PF) 2 MG/ML injection 2 mg  2 mg Intravenous Q4H PRN Patel, Sona, MD   2 mg at 01/04/24 1229   multivitamin with minerals tablet 1 tablet  1 tablet Per Tube Daily Patel, Sona, MD   1 tablet at 01/04/24 1008   nystatin (MYCOSTATIN/NYSTOP) topical powder 1 Application  1 Application Topical TID Niu, Xilin, MD   1 Application at 01/04/24 1008   ondansetron (ZOFRAN) injection 4 mg  4 mg Intravenous Q8H PRN Niu, Xilin, MD       polyethylene glycol (MIRALAX  / GLYCOLAX ) packet 17 g  17 g Oral Daily PRN Niu, Xilin, MD       senna-docusate (Senokot-S) tablet 2 tablet  2 tablet Oral QHS Niu, Xilin, MD   2 tablet at 01/03/24 2120   tiZANidine (ZANAFLEX) tablet 4 mg  4 mg Oral BID Niu, Xilin, MD   4 mg at 01/04/24 1008   traMADol  (ULTRAM ) tablet 50 mg  50 mg Oral Q6H PRN Patel, Sona, MD       traZODone (DESYREL) tablet 50 mg  50 mg Oral QHS PRN Niu, Xilin, MD       zinc sulfate (50mg  elemental zinc) capsule 220 mg  220 mg Oral Daily Patel, Sona, MD   220 mg at 01/04/24 1008     Discharge Medications: Please see discharge summary for a list of discharge medications.  Relevant Imaging Results:  Relevant Lab  Results:   Additional Information SSN: [id number]  Quintella Suzen Jansky, RN

## 2024-01-05 DIAGNOSIS — E66813 Obesity, class 3: Secondary | ICD-10-CM | POA: Diagnosis not present

## 2024-01-05 DIAGNOSIS — A419 Sepsis, unspecified organism: Secondary | ICD-10-CM | POA: Diagnosis not present

## 2024-01-05 DIAGNOSIS — N179 Acute kidney failure, unspecified: Secondary | ICD-10-CM | POA: Diagnosis not present

## 2024-01-05 DIAGNOSIS — F321 Major depressive disorder, single episode, moderate: Secondary | ICD-10-CM | POA: Diagnosis not present

## 2024-01-05 LAB — URINE CULTURE: Culture: >=100000 — AB

## 2024-01-05 LAB — CULTURE, BLOOD (ROUTINE X 2)

## 2024-01-05 LAB — GLUCOSE, CAPILLARY
Glucose-Capillary: 83 mg/dL (ref 70–99)
Glucose-Capillary: 88 mg/dL (ref 70–99)

## 2024-01-05 MED ORDER — AMOXICILLIN-POT CLAVULANATE 875-125 MG PO TABS
1.0000 | ORAL_TABLET | Freq: Two times a day (BID) | ORAL | Status: DC
  Administered 2024-01-05: 1 via ORAL
  Filled 2024-01-05: qty 1

## 2024-01-05 MED ORDER — ZINC SULFATE 220 (50 ZN) MG PO CAPS: 220.0000 mg | ORAL_CAPSULE | Freq: Every day | ORAL | 0 refills | Status: DC

## 2024-01-05 MED ORDER — TRAMADOL HCL 50 MG PO TABS: 50.0000 mg | ORAL_TABLET | Freq: Four times a day (QID) | ORAL | 0 refills | Status: DC | PRN

## 2024-01-05 MED ORDER — HYDROCODONE-ACETAMINOPHEN 5-325 MG PO TABS: 1.0000 | ORAL_TABLET | Freq: Four times a day (QID) | ORAL | 0 refills | Status: DC | PRN

## 2024-01-05 MED ORDER — ADULT MULTIVITAMIN W/MINERALS CH: 1.0000 | ORAL_TABLET | Freq: Every day | ORAL | Status: DC

## 2024-01-05 MED ORDER — ENSURE PLUS HIGH PROTEIN PO LIQD: 237.0000 mL | Freq: Two times a day (BID) | ORAL | 0 refills | Status: DC

## 2024-01-05 MED ORDER — ASCORBIC ACID 500 MG PO TABS: 500.0000 mg | ORAL_TABLET | Freq: Two times a day (BID) | ORAL | 0 refills | Status: DC

## 2024-01-05 MED ORDER — ADULT MULTIVITAMIN W/MINERALS CH: 1.0000 | ORAL_TABLET | Freq: Every day | ORAL | 0 refills | Status: DC

## 2024-01-05 MED ORDER — AMOXICILLIN-POT CLAVULANATE 875-125 MG PO TABS
1.0000 | ORAL_TABLET | Freq: Two times a day (BID) | ORAL | 0 refills | Status: AC
Start: 2024-01-05 — End: 2024-01-10

## 2024-01-05 NOTE — Discharge Instructions (Signed)
 Wear your Neck collar as instructed by Neurosurgery. Pt will need appt to f/u Dr Claudene in 1-2 weeks

## 2024-01-05 NOTE — Discharge Summary (Signed)
 Physician Discharge Summary   Patient: Doris Curry MRN: [id number] DOB: 1945-09-07  Admit date:     01/02/2024  Discharge date: 01/05/24  Discharge Physician: Leita Blanch   PCP: [place], [facility]   Recommendations at discharge:    F/u PCP in 1-2 weeks  Discharge Diagnoses: Principal Problem:   Sepsis [facility]) Active Problems:   Sepsis secondary to UTI (HCC)   AKI (acute kidney injury) (HCC)   Abnormal EKG   Hypertension   Stroke (HCC)   HLD (hyperlipidemia)   Pressure ulcer   Moderate major depression, single episode (HCC)   Obesity, Class III, BMI 40-49.9 (morbid obesity)   Neck pain   Intertriginous candidiasis   Leukocytosis   Lower urinary tract infectious disease   Doris Curry is a 78 y.o. female with medical history significant of hypertension, hyperlipidemia, stroke, gout, GERD, depression, CKD-3B, morbid obesity, lymphedema, chronic venous insufficient change in legs, chronic pain, overactive bladder, bedbound, who presents with burning with urination and confusion.    She states that she has neck pain which is managed by [facility]. She had MRI of C-spin on 12/22/23 as outpt.   MRI of C-spine on 12/22/2023 Edema throughout the dens and involving the lateral masses of C1 and C2. Edema within the soft tissues surrounding the dens and adjacent to the bilateral atlantoaxial articulations. Recommend correlation with history of trauma and consider CT of the cervical spine for further evaluation. Alternatively, findings could be present in the setting of inflammatory or crystalline arthropathy versus infection.   Prevertebral edema extending from C2 to the upper thoracic spine.   Degenerative changes as above. No high-grade spinal canal stenosis or cord compression. No high-grade foraminal stenosis.   CT cervical spine  --type 2 fracture of the dens with posterior and right lateral displacement of the fracture fragment as well as the lateral masses of  C1.   Irregular lucencies within the dens, lateral masses of C1, occipital condyles, and tip of basion may be due to healing resorption from subacute fractures. However inflammatory or crystalline arthropathy or infection could have this appearance. There is soft tissue prominence about the atlantoaxial joint which may represent pannus formation, hematoma, or infection. No definite spinal canal narrowing. Neurosurgery consult is recommended.    Sepsis secondary to UTI [facility]):  BCID Stap epidermidis--likely contamination --Patient has sepsis with WBC 13.6, initial heart rate 97.  Patient was initially hypotensive with blood pressure 73/63, which improved to 2 SBP 90s after giving 2 L LR.  Patient is still in a high risk of developing hypotension.  --Initial Antibiotics: Rocephin 2 g daily + vancomycin - Midodrine --d/c now--bp stabilized. Use prn - lactic acid level 1.1 -- DC vancomycin. -- Urine culture Klebsiella-- will switch to oral today/ d/w RPH and will do augmentin --wbc 9.9 --BC 1/2--Staph coag neg--likely contamination   Acute renal failure superimposed on stage 3b chronic kidney disease (HCC): Likely due to UTI, ATN is also possible due to hypotension. --resume home meds labs stable - Avoid using renal toxic medications   Abnormal EKG: Patient has a diffused ST elevation on EKG he except for lead III.  ED physician discussed with Dr. Florencio of cardiology.  Since patient does not have chest pain, he does not think patient has STEMI.  Initial troponin negative, 16. - Trend troponin--flat - Continue aspirin, Lipitor   Neck pain: MRI of C-spine on 12/22/2023 showed edema throughout the dens and involving the lateral masses of C1 and C2. Edema within the  soft tissues surrounding the dens and adjacent to the bilateral atlantoaxial articulations.  - C-collar - As needed Norco - prn Tizanidine --Neurosurgery Consulted Dr Gar -- conservative management, neck collar, PRN pain  meds, follow-up as outpatient -- pt will f/u Dr claudene as out pt   Hypertension -- blood pressure improved. Resume home BP meds   Type II diabetes well-controlled -- resume metformin -- last A1c per outpatient lab 6.8   Stroke (HCC) -Aspirin, Lipitor   HLD (hyperlipidemia) -Lipitor   Pressure ulcer -Wound care consult noted   Moderate major depression, single episode (HCC) -Continue home medications   Obesity, Class III, BMI 40-49.9 (morbid obesity): Patient has morbid obesity.  Body weight 99.8, BMI 41.59 -Encourage losing weight - Exercise healthy diet   Intertriginous candidiasis: - Continue nystatin powder and ketoconazole cream  Overall at baseline. Will d/c to rehab today. Pt agreeable   Procedures: Family communication :spoke with dter Sotero on 6/30 Consults : neurosurgery CODE STATUS: full DVT Prophylaxis : heparin    Pain control - [place]  Controlled Substance Reporting System database was reviewed. and patient was instructed, not to drive, operate heavy machinery, perform activities at heights, swimming or participation in water activities or provide baby-sitting services while on Pain, Sleep and Anxiety Medications; until their outpatient Physician has advised to do so again. Also recommended to not to take more than prescribed Pain, Sleep and Anxiety Medications.   Diet recommendation:  Discharge Diet Orders (From admission, onward)     Start     Ordered   01/05/24 0000  Diet - low sodium heart healthy        01/05/24 1036            DISCHARGE MEDICATION: Allergies as of 01/05/2024       Reactions   Citalopram    Other reaction(s): Blood Disorder Easy bruising   Bupropion Anxiety   150 mg.  Has tolerated 75        Medication List     STOP taking these medications    colchicine 0.6 MG tablet   esomeprazole 40 MG capsule Commonly known as: NEXIUM   pravastatin 40 MG tablet Commonly known as: PRAVACHOL   predniSONE  10 MG (21)  Tbpk tablet Commonly known as: STERAPRED UNI-PAK 21 TAB       TAKE these medications    acetaminophen 500 MG tablet Commonly known as: TYLENOL Take 500 mg by mouth every 8 (eight) hours as needed.   amoxicillin-clavulanate 875-125 MG tablet Commonly known as: AUGMENTIN Take 1 tablet by mouth every 12 (twelve) hours for 10 doses.   antiseptic oral rinse Liqd 10 mLs by Mouth Rinse route 4 (four) times daily as needed for dry mouth.   ascorbic acid 500 MG tablet Commonly known as: VITAMIN C Take 1 tablet (500 mg total) by mouth 2 (two) times daily.   aspirin EC 81 MG tablet Take 81 mg by mouth daily. At 8 am   atorvastatin 40 MG tablet Commonly known as: LIPITOR Take 40 mg by mouth daily.   ciclopirox 0.77 % cream Commonly known as: LOPROX Apply 1 application  topically daily.   DULoxetine  30 MG capsule Commonly known as: Cymbalta  Take 1 capsule (30 mg total) by mouth 2 (two) times daily.   feeding supplement Liqd Take 237 mLs by mouth 2 (two) times daily between meals.   fluocinonide cream 0.05 % Commonly known as: LIDEX Apply 1 application topically 2 times daily as needed to eczema of left hand.  fluticasone  50 MCG/ACT nasal spray Commonly known as: FLONASE  Place 2 sprays into both nostrils daily. 8 am   furosemide 20 MG tablet Commonly known as: LASIX Take 20 mg by mouth every other day. 8 am   gabapentin 300 MG capsule Commonly known as: NEURONTIN Take 300 mg by mouth 3 (three) times daily.   guaiFENesin 600 MG 12 hr tablet Commonly known as: MUCINEX Take by mouth 2 (two) times daily as needed.   HYDROcodone-acetaminophen 5-325 MG tablet Commonly known as: NORCO/VICODIN Take 1 tablet by mouth every 6 (six) hours as needed. What changed: Another medication with the same name was removed. Continue taking this medication, and follow the directions you see here.   ipratropium-albuterol 0.5-2.5 (3) MG/3ML Soln Commonly known as: DUONEB Take 3 mLs by  nebulization every 6 (six) hours as needed.   ketoconazole 2 % cream Commonly known as: NIZORAL Apply 1 Application topically daily.   lidocaine  5 % Commonly known as: LIDODERM  Place 1 patch onto the skin daily. At 8 am to right knee for pain. Remove patch at 8 pm. Remove & Discard patch within 12 hours or as directed by MD   lisinopril 20 MG tablet Commonly known as: ZESTRIL Take 20 mg by mouth daily. At 8 am   loratadine 10 MG tablet Commonly known as: CLARITIN Take 10 mg by mouth daily.   magnesium hydroxide 400 MG/5ML suspension Commonly known as: MILK OF MAGNESIA Take 30 mLs by mouth See admin instructions. Constipation/no BM for 2 days, every 4 hours prn.   melatonin 5 MG Tabs Take 5 mg by mouth at bedtime.   metFORMIN 500 MG tablet Commonly known as: GLUCOPHAGE Take 500 mg by mouth 2 (two) times daily.   multivitamin with minerals Tabs tablet Take 1 tablet by mouth daily. Start taking on: January 06, 2024   nystatin powder Commonly known as: MYCOSTATIN/NYSTOP Apply 1 Application topically 3 (three) times daily.   polyethylene glycol 17 g packet Commonly known as: MIRALAX  / GLYCOLAX  Take 17 g by mouth daily. Mix one tablespoon with 8oz of your favorite juice or water every day until you are having soft formed stools. Then start taking once daily if you didn't have a stool the day before.   Refresh 1.4-0.6 % Soln Generic drug: Polyvinyl Alcohol-Povidone PF Place 2 drops into the right eye 4 (four) times daily. At 8 am, 1 pm, 5 pm, 8 pm   senna-docusate 8.6-50 MG tablet Commonly known as: Senokot-S Take 2 tablets by mouth at bedtime.   tiZANidine 4 MG tablet Commonly known as: ZANAFLEX Take 4 mg by mouth in the morning and at bedtime.   traMADol  50 MG tablet Commonly known as: Ultram  Take 1 tablet (50 mg total) by mouth every 6 (six) hours as needed.   traZODone 50 MG tablet Commonly known as: DESYREL Take 50 mg by mouth at bedtime as needed for sleep.    Voltaren Arthritis Pain 1 % Gel Generic drug: diclofenac Sodium Apply 2 g topically 2 (two) times daily as needed.   zinc sulfate (50mg  elemental zinc) 220 (50 Zn) MG capsule Take 1 capsule (220 mg total) by mouth daily. Start taking on: January 06, 2024               Discharge Care Instructions  (From admission, onward)           Start     Ordered   01/05/24 0000  Discharge wound care:       Comments:  Wound Care Orders (From admission, onward)      Start     Ordered   01/05/24 0500    Wound care  Daily      Comments: 1. Cleanse buttock wound with saline, pat dry Cover with single layer of xeroform, top with foam. Change daily 2. Foam dressing to right heel, change Q 3 days or PRN soiling  01/04/24 0906   01/05/24 1036            Follow-up Information     [place], [employer]. Schedule an appointment as soon as possible for a visit in 1 week(s).   Specialty: Skilled Nursing Facility Contact information: [address] [place] [address] [phone]                Discharge Exam: Doris Curry   01/04/24 0542  Weight: 99.8 kg    GENERAL:  78 y.o.-year-old patient with no acute distress. Morbid obesity. Chronically ill LUNGS: Normal breath sounds bilaterally decreased breath sounds bases CARDIOVASCULAR: S1, S2 normal. No murmur   ABDOMEN: Soft, nominal obesity EXTREMITIES: foot drop with chronic bilateral   edema b/l.    NEUROLOGIC C collar+  patient is alert and awake right side hemiplegia SKIN: per RN   Condition at discharge: fair  The results of significant diagnostics from this hospitalization (including imaging, microbiology, ancillary and laboratory) are listed below for reference.   Imaging Studies: CT CERVICAL SPINE WO CONTRAST Result Date: 01/03/2024 CLINICAL DATA:  Chronic neck pain. MRI of C-spine on 12/22/2023 showed edema throughout the dens in involving the lateral masses of C1 and C2. EXAM: CT CERVICAL SPINE WITHOUT  CONTRAST TECHNIQUE: Multidetector CT imaging of the cervical spine was performed without intravenous contrast. Multiplanar CT image reconstructions were also generated. RADIATION DOSE REDUCTION: This exam was performed according to the departmental dose-optimization program which includes automated exposure control, adjustment of the mA and/or kV according to patient size and/or use of iterative reconstruction technique. COMPARISON:  MRI cervical spine 12/22/2023 and CT 11/05/2023 FINDINGS: Alignment: Posterior and rightward subluxation of C1 in relation to the base of the dens. Skull base and vertebrae: Fracture and possible erosions within the dens. Lytic lucencies versus fractures throughout the lateral masses of C1, the occipital condyles, and the basion. No additional fractures. Soft tissues and spinal canal: No definite spinal canal narrowing. Soft tissue prominence about the atlantoaxial joint may be due to pannus formation, hematoma, or infection. Prevertebral soft tissue swelling similar to prior MRI. Disc levels: Multilevel spondylosis, disc space height loss, degenerative endplate changes greatest at C5-C6 and C6-C7 where it is advanced. Upper chest: No acute abnormality. Other: None. IMPRESSION: Type 2 fracture of the dens with posterior and right lateral displacement of the fracture fragment as well as the lateral masses of C1. Irregular lucencies within the dens, lateral masses of C1, occipital condyles, and tip of basion may be due to healing resorption from subacute fractures. However inflammatory or crystalline arthropathy or infection could have this appearance. There is soft tissue prominence about the atlantoaxial joint which may represent pannus formation, hematoma, or infection. No definite spinal canal narrowing. Neurosurgery consult is recommended. Consider further evaluation with MRI cervical spine with IV contrast. Critical Value/emergent results were called by telephone at the time of  interpretation on 01/03/2024 at 3:52 am to provider Erminio Cone, who verbally acknowledged these results. Electronically Signed   By: Norman Gatlin M.D.   On: 01/03/2024 04:04   DG Chest Portable 1 View Result Date:  01/02/2024 CLINICAL DATA:  Confusion. EXAM: PORTABLE CHEST 1 VIEW COMPARISON:  07/17/2017 FINDINGS: Stable heart size and mediastinal contours. No airspace consolidation. No pneumothorax or pleural effusion. Normal pulmonary vasculature. No acute osseous findings. IMPRESSION: No acute chest findings. Electronically Signed   By: Andrea Gasman M.D.   On: 01/02/2024 22:43   MR CERVICAL SPINE WO CONTRAST Result Date: 12/29/2023 CLINICAL DATA:  Neck pain worse for the past week. EXAM: MRI CERVICAL SPINE WITHOUT CONTRAST TECHNIQUE: Multiplanar, multisequence MR imaging of the cervical spine was performed. No intravenous contrast was administered. COMPARISON:  CT cervical spine 11/05/2023. FINDINGS: Alignment: Straightening and slight reversal of the normal cervical lordosis. Similar trace anterolisthesis of C4 on C5. Vertebrae: Slightly limits evaluation of the upper cervical spine on sagittal T2 images due to artifact. There is diffuse edema throughout the dens also involving the lateral masses of C1 and C2 more pronounced on the right. There is edema at the atlanto dens and bilateral atlantoaxial articulations. No additional bone marrow edema. Vertebral body heights are maintained. Cord: Normal signal and morphology. Posterior Fossa, vertebral arteries, paraspinal tissues: The posterior fossa is unremarkable. Bilateral vertebral artery flow voids are visualized. There is edema in the soft tissues surrounding the dens. The tectal membrane and posterior longitudinal ligament is intact. Edema within the soft tissues adjacent to the C1-2 level. There is additional prevertebral edema extending from C2 to the upper thoracic spine. Disc levels: C2-3: No significant spinal canal stenosis. No  significant foraminal stenosis. Bilateral facet arthrosis. C3-4: Small disc osteophyte complex. No significant spinal canal stenosis. Bilateral facet arthrosis. Uncovertebral hypertrophy on the left. Mild left foraminal stenosis. C4-5: Disc osteophyte complex eccentric to the right. No significant spinal canal stenosis. Bilateral facet arthrosis. Mild bilateral foraminal stenosis. C5-6: Mild disc height loss. Disc osteophyte complex indents the ventral thecal sac with mild flattening of the ventral cervical cord. Bilateral facet arthrosis. Uncovertebral hypertrophy on the right. Mild right foraminal stenosis. C6-7: Mild disc height loss. Disc osteophyte complex indents the ventral thecal sac with mild flattening of the ventral cervical cord. Bilateral facet arthrosis and uncovertebral hypertrophy. Mild bilateral foraminal stenosis. C7-T1: No significant spinal canal stenosis. Bilateral facet arthrosis. IMPRESSION: Edema throughout the dens and involving the lateral masses of C1 and C2. Edema within the soft tissues surrounding the dens and adjacent to the bilateral atlantoaxial articulations. Recommend correlation with history of trauma and consider CT of the cervical spine for further evaluation. Alternatively, findings could be present in the setting of inflammatory or crystalline arthropathy versus infection. Prevertebral edema extending from C2 to the upper thoracic spine. Degenerative changes as above. No high-grade spinal canal stenosis or cord compression. No high-grade foraminal stenosis. Electronically Signed   By: Donnice Mania M.D.   On: 12/29/2023 14:28    Microbiology: Results for orders placed or performed during the hospital encounter of 01/02/24  Blood culture (routine x 2)     Status: Abnormal   Collection Time: 01/02/24  9:45 PM   Specimen: BLOOD  Result Value Ref Range Status   Specimen Description   Final    BLOOD RIGHT ARM Performed at [facility], [address],  [place], [place] [address]    Special Requests   Final    BOTTLES DRAWN AEROBIC AND ANAEROBIC Blood Culture results may not be optimal due to an inadequate volume of blood received in culture bottles Performed at [facility], [address], [place], [place] [address]    Culture  Setup Time  Final    GRAM POSITIVE COCCI IN BOTH AEROBIC AND ANAEROBIC BOTTLES CRITICAL RESULT CALLED TO, READ BACK BY AND VERIFIED WITH: KISHAN Jaimere Feutz AT 1008 01/03/24.PMF    Culture (A)  Final    COAGULASE NEGATIVE STAPHYLOCOCCUS STAPHYLOCOCCUS EPIDERMIDIS THE SIGNIFICANCE OF ISOLATING THIS ORGANISM FROM A SINGLE SET OF BLOOD CULTURES WHEN MULTIPLE SETS ARE DRAWN IS UNCERTAIN. PLEASE NOTIFY THE MICROBIOLOGY DEPARTMENT WITHIN ONE WEEK IF SPECIATION AND SENSITIVITIES ARE REQUIRED. Performed at [facility], [address] [place], [place] [address]    Report Status 01/05/2024 FINAL  Final  Urine Culture (for pregnant, neutropenic or urologic patients or patients with an indwelling urinary catheter)     Status: Abnormal   Collection Time: 01/02/24  9:45 PM   Specimen: Urine, Random  Result Value Ref Range Status   Specimen Description   Final    URINE, RANDOM Performed at [facility], [address], [place], [place] [address]    Special Requests   Final    NONE Performed at [facility], [address] [place], [place] [address]    Culture (A)  Final    >=100,000 COLONIES/mL KLEBSIELLA PNEUMONIAE 80,000 COLONIES/mL ENTEROCOCCUS FAECALIS    Report Status 01/05/2024 FINAL  Final   Organism ID, Bacteria KLEBSIELLA PNEUMONIAE (A)  Final   Organism ID, Bacteria ENTEROCOCCUS FAECALIS (A)  Final      Susceptibility   Enterococcus faecalis - MIC*    AMPICILLIN <=2 SENSITIVE Sensitive     NITROFURANTOIN <=16 SENSITIVE Sensitive     VANCOMYCIN 1 SENSITIVE Sensitive     * 80,000 COLONIES/mL ENTEROCOCCUS FAECALIS   Klebsiella pneumoniae - MIC*    AMPICILLIN RESISTANT Resistant      CEFAZOLIN <=4 SENSITIVE Sensitive     CEFEPIME <=0.12 SENSITIVE Sensitive     CEFTRIAXONE <=0.25 SENSITIVE Sensitive     CIPROFLOXACIN <=0.25 SENSITIVE Sensitive     GENTAMICIN <=1 SENSITIVE Sensitive     IMIPENEM <=0.25 SENSITIVE Sensitive     NITROFURANTOIN 64 INTERMEDIATE Intermediate     TRIMETH/SULFA <=20 SENSITIVE Sensitive     AMPICILLIN/SULBACTAM <=2 SENSITIVE Sensitive     PIP/TAZO <=4 SENSITIVE Sensitive ug/mL    * >=100,000 COLONIES/mL KLEBSIELLA PNEUMONIAE  Blood Culture ID Panel (Reflexed)     Status: Abnormal   Collection Time: 01/02/24  9:45 PM  Result Value Ref Range Status   Enterococcus faecalis NOT DETECTED NOT DETECTED Final   Enterococcus Faecium NOT DETECTED NOT DETECTED Final   Listeria monocytogenes NOT DETECTED NOT DETECTED Final   Staphylococcus species DETECTED (A) NOT DETECTED Final    Comment: CRITICAL RESULT CALLED TO, READ BACK BY AND VERIFIED WITH: KISHAN Ceniyah Thorp AT 1008 01/03/24.PMF    Staphylococcus aureus (BCID) NOT DETECTED NOT DETECTED Final   Staphylococcus epidermidis DETECTED (A) NOT DETECTED Final    Comment: Methicillin (oxacillin) resistant coagulase negative staphylococcus. Possible blood culture contaminant (unless isolated from more than one blood culture draw or clinical case suggests pathogenicity). No antibiotic treatment is indicated for blood  culture contaminants. CRITICAL RESULT CALLED TO, READ BACK BY AND VERIFIED WITH: KISHAN Calleen Alvis AT 1008 01/03/24.PMF    Staphylococcus lugdunensis NOT DETECTED NOT DETECTED Final   Streptococcus species NOT DETECTED NOT DETECTED Final   Streptococcus agalactiae NOT DETECTED NOT DETECTED Final   Streptococcus pneumoniae NOT DETECTED NOT DETECTED Final   Streptococcus pyogenes NOT DETECTED NOT DETECTED Final   A.calcoaceticus-baumannii NOT DETECTED NOT DETECTED Final   Bacteroides fragilis NOT DETECTED NOT DETECTED Final  Enterobacterales NOT DETECTED NOT DETECTED Final   Enterobacter cloacae  complex NOT DETECTED NOT DETECTED Final   Escherichia coli NOT DETECTED NOT DETECTED Final   Klebsiella aerogenes NOT DETECTED NOT DETECTED Final   Klebsiella oxytoca NOT DETECTED NOT DETECTED Final   Klebsiella pneumoniae NOT DETECTED NOT DETECTED Final   Proteus species NOT DETECTED NOT DETECTED Final   Salmonella species NOT DETECTED NOT DETECTED Final   Serratia marcescens NOT DETECTED NOT DETECTED Final   Haemophilus influenzae NOT DETECTED NOT DETECTED Final   Neisseria meningitidis NOT DETECTED NOT DETECTED Final   Pseudomonas aeruginosa NOT DETECTED NOT DETECTED Final   Stenotrophomonas maltophilia NOT DETECTED NOT DETECTED Final   Candida albicans NOT DETECTED NOT DETECTED Final   Candida auris NOT DETECTED NOT DETECTED Final   Candida glabrata NOT DETECTED NOT DETECTED Final   Candida krusei NOT DETECTED NOT DETECTED Final   Candida parapsilosis NOT DETECTED NOT DETECTED Final   Candida tropicalis NOT DETECTED NOT DETECTED Final   Cryptococcus neoformans/gattii NOT DETECTED NOT DETECTED Final   Methicillin resistance mecA/C DETECTED (A) NOT DETECTED Final    Comment: CRITICAL RESULT CALLED TO, READ BACK BY AND VERIFIED WITH: KISHAN Wang [place] AT 1008 01/03/24.PMF Performed at [facility], [address] [place], [place] [address]   Blood culture (routine x 2)     Status: None (Preliminary result)   Collection Time: 01/03/24  8:07 AM   Specimen: BLOOD  Result Value Ref Range Status   Specimen Description BLOOD RIGHT ANTECUBITAL  Final   Special Requests   Final    BOTTLES DRAWN AEROBIC ONLY Blood Culture results may not be optimal due to an inadequate volume of blood received in culture bottles   Culture   Final    NO GROWTH 2 DAYS Performed at [facility], [address], [place], [place] [address]    Report Status PENDING  Incomplete  MRSA Next Gen by PCR, Nasal     Status: None   Collection Time: 01/03/24  8:57 AM   Specimen: Nasal Mucosa; Nasal Swab   Result Value Ref Range Status   MRSA by PCR Next Gen NOT DETECTED NOT DETECTED Final    Comment: (NOTE) The GeneXpert MRSA Assay (FDA approved for NASAL specimens only), is one component of a comprehensive MRSA colonization surveillance program. It is not intended to diagnose MRSA infection nor to guide or monitor treatment for MRSA infections. Test performance is not FDA approved in patients less than 70 years old. Performed at [facility], [address] [place], [place] [address]     Labs: CBC: Recent Labs  Lab 01/02/24 2120 01/03/24 0924  WBC 13.6* 9.3  NEUTROABS 11.1*  --   HGB 11.3* 13.6  HCT 37.0 47.8*  MCV 93.9 98.8  PLT 254 118*   Basic Metabolic Panel: Recent Labs  Lab 01/02/24 2120 01/03/24 0807 01/04/24 1047  NA 139 139  --   K 4.3 4.2  --   CL 107 107  --   CO2 22 23  --   GLUCOSE 136* 148*  --   BUN 55* 50*  --   CREATININE 1.96* 1.52* 1.22*  CALCIUM 9.4 9.0  --    Liver Function Tests: No results for input(s): AST, ALT, ALKPHOS, BILITOT, PROT, ALBUMIN in the last 168 hours. CBG: Recent Labs  Lab 01/04/24 1651 01/04/24 2128 01/05/24 0744  GLUCAP 119* 87 83    Discharge time spent: greater than 30 minutes.  Signed: Myeshia Fojtik,  MD [facility] 01/05/2024

## 2024-01-05 NOTE — TOC Transition Note (Signed)
 Transition of Care [facility]) - Discharge Note   Patient Details  Name: Doris Curry MRN: [id number] Date of Birth: 05/09/1946  Transition of Care [facility]) CM/SW Contact:  Marinda Cooks, RN Phone Number: 01/05/2024, 12:24 PM   Clinical Narrative:    This CM updated by covering MD pt medically cleared to dc today and has active DC order . This CM spoke with Barnie  Admission liaison @  [facility] and confirmed pt can  return to facility. DC transportation confirmed for pt with [facility] team updated . No additional DC needs requested by medical team or identified by CM at this time .    Final next level of care: Skilled Nursing Facility Barriers to Discharge: No Barriers Identified     Name of family member notified: Asberry Saner  (Daughter)t  [phone] Patient and family notified of of transfer: 01/05/24  Discharge Plan and Services Additional resources added to the After Visit Summary for       Post Acute Care Choice: Skilled Nursing Facility            DME Agency: NA       HH Arranged: NA          Social Drivers of Health (SDOH) Interventions SDOH Screenings   Food Insecurity: No Food Insecurity (01/03/2024)  Housing: Low Risk  (01/03/2024)  Transportation Needs: No Transportation Needs (01/03/2024)  Utilities: Not At Risk (01/03/2024)  Social Connections: Patient Declined (01/03/2024)  Tobacco Use: Medium Risk (01/04/2024)     Readmission Risk Interventions     No data to display

## 2024-01-07 ENCOUNTER — Telehealth: Payer: Self-pay | Admitting: Physician Assistant

## 2024-01-07 NOTE — Telephone Encounter (Signed)
 Spoke with Dr Claudene and Davetta at [facility], they are needing to know when patient should be wearing her neck collar as the discharge directions from the hospital did not mention this.  Per Dr Claudene needs to wear collar anytime patient is out of the bed walking, sitting up right, moving around, etc.  Davetta asked for this to be faxed over to them at [phone]  Attn: davetta myers unit coordinator.

## 2024-01-07 NOTE — Telephone Encounter (Signed)
 Davetta from [facility] called to obtain clarification on the patient's brace for her C1 fracture. She states to ask for the B [employer].

## 2024-01-08 LAB — CULTURE, BLOOD (ROUTINE X 2): Culture: NO GROWTH

## 2024-01-11 NOTE — Telephone Encounter (Signed)
 Doris Curry from [facility] is calling. She needs clarification: They sit her up in bed to give her medication, will she need to have the brace on? They roll her over to change her, will she need to have the brace on?  Please fax information to [phone]

## 2024-01-11 NOTE — Telephone Encounter (Signed)
 Per Dr Claudene patient will need to wear a neck collar when she is been rolled over to get changed or cleaned up.  Order re faxed with updated notes as requested.

## 2024-01-15 ENCOUNTER — Emergency Department

## 2024-01-15 ENCOUNTER — Other Ambulatory Visit: Payer: Self-pay

## 2024-01-15 ENCOUNTER — Observation Stay
Admission: EM | Admit: 2024-01-15 | Discharge: 2024-02-05 | Disposition: E | Attending: Internal Medicine | Admitting: Internal Medicine

## 2024-01-15 DIAGNOSIS — Z87891 Personal history of nicotine dependence: Secondary | ICD-10-CM | POA: Diagnosis not present

## 2024-01-15 DIAGNOSIS — R4182 Altered mental status, unspecified: Secondary | ICD-10-CM

## 2024-01-15 DIAGNOSIS — D72829 Elevated white blood cell count, unspecified: Secondary | ICD-10-CM | POA: Diagnosis not present

## 2024-01-15 DIAGNOSIS — J9602 Acute respiratory failure with hypercapnia: Secondary | ICD-10-CM

## 2024-01-15 DIAGNOSIS — A419 Sepsis, unspecified organism: Secondary | ICD-10-CM | POA: Diagnosis not present

## 2024-01-15 DIAGNOSIS — N189 Chronic kidney disease, unspecified: Secondary | ICD-10-CM | POA: Insufficient documentation

## 2024-01-15 DIAGNOSIS — R464 Slowness and poor responsiveness: Secondary | ICD-10-CM

## 2024-01-15 DIAGNOSIS — E1122 Type 2 diabetes mellitus with diabetic chronic kidney disease: Secondary | ICD-10-CM | POA: Insufficient documentation

## 2024-01-15 DIAGNOSIS — R319 Hematuria, unspecified: Secondary | ICD-10-CM

## 2024-01-15 DIAGNOSIS — J690 Pneumonitis due to inhalation of food and vomit: Secondary | ICD-10-CM

## 2024-01-15 DIAGNOSIS — N179 Acute kidney failure, unspecified: Secondary | ICD-10-CM | POA: Diagnosis not present

## 2024-01-15 DIAGNOSIS — I639 Cerebral infarction, unspecified: Secondary | ICD-10-CM

## 2024-01-15 DIAGNOSIS — J9601 Acute respiratory failure with hypoxia: Secondary | ICD-10-CM | POA: Diagnosis not present

## 2024-01-15 DIAGNOSIS — I129 Hypertensive chronic kidney disease with stage 1 through stage 4 chronic kidney disease, or unspecified chronic kidney disease: Secondary | ICD-10-CM | POA: Insufficient documentation

## 2024-01-15 DIAGNOSIS — R579 Shock, unspecified: Principal | ICD-10-CM

## 2024-01-15 DIAGNOSIS — G928 Other toxic encephalopathy: Secondary | ICD-10-CM | POA: Diagnosis not present

## 2024-01-15 DIAGNOSIS — I69351 Hemiplegia and hemiparesis following cerebral infarction affecting right dominant side: Secondary | ICD-10-CM

## 2024-01-15 DIAGNOSIS — R6521 Severe sepsis with septic shock: Secondary | ICD-10-CM | POA: Insufficient documentation

## 2024-01-15 DIAGNOSIS — J9622 Acute and chronic respiratory failure with hypercapnia: Secondary | ICD-10-CM | POA: Diagnosis not present

## 2024-01-15 DIAGNOSIS — E872 Acidosis, unspecified: Secondary | ICD-10-CM

## 2024-01-15 DIAGNOSIS — N390 Urinary tract infection, site not specified: Secondary | ICD-10-CM | POA: Insufficient documentation

## 2024-01-15 DIAGNOSIS — J189 Pneumonia, unspecified organism: Secondary | ICD-10-CM

## 2024-01-15 LAB — BLOOD GAS, VENOUS
Acid-base deficit: 21.7 mmol/L — ABNORMAL HIGH (ref 0.0–2.0)
Bicarbonate: 10.5 mmol/L — ABNORMAL LOW (ref 20.0–28.0)
O2 Saturation: 58.3 %
Patient temperature: 37
pCO2, Ven: 49 mmHg (ref 44–60)
pH, Ven: <6.95 — CL (ref 7.25–7.43)
pO2, Ven: 44 mmHg (ref 32–45)

## 2024-01-15 LAB — COMPREHENSIVE METABOLIC PANEL WITH GFR
ALT: 37 U/L (ref 0–44)
AST: 93 U/L — ABNORMAL HIGH (ref 15–41)
Albumin: 2.3 g/dL — ABNORMAL LOW (ref 3.5–5.0)
Alkaline Phosphatase: 173 U/L — ABNORMAL HIGH (ref 38–126)
Anion gap: 27 — ABNORMAL HIGH (ref 5–15)
BUN: 52 mg/dL — ABNORMAL HIGH (ref 8–23)
CO2: 11 mmol/L — ABNORMAL LOW (ref 22–32)
Calcium: 9.4 mg/dL (ref 8.9–10.3)
Chloride: 100 mmol/L (ref 98–111)
Creatinine, Ser: 3.64 mg/dL — ABNORMAL HIGH (ref 0.44–1.00)
GFR, Estimated: 12 mL/min — ABNORMAL LOW (ref >60)
Glucose, Bld: 136 mg/dL — ABNORMAL HIGH (ref 70–99)
Potassium: 6.6 mmol/L (ref 3.5–5.1)
Sodium: 138 mmol/L (ref 135–145)
Total Bilirubin: 0.9 mg/dL (ref 0.0–1.2)
Total Protein: 7.1 g/dL (ref 6.5–8.1)

## 2024-01-15 LAB — DIFFERENTIAL
Abs Immature Granulocytes: 0.46 K/uL — ABNORMAL HIGH (ref 0.00–0.07)
Basophils Absolute: 0.1 K/uL (ref 0.0–0.1)
Basophils Relative: 1 %
Eosinophils Absolute: 0 K/uL (ref 0.0–0.5)
Eosinophils Relative: 0 %
Immature Granulocytes: 3 %
Lymphocytes Relative: 16 %
Lymphs Abs: 2.9 K/uL (ref 0.7–4.0)
Monocytes Absolute: 1.1 K/uL — ABNORMAL HIGH (ref 0.1–1.0)
Monocytes Relative: 6 %
Neutro Abs: 13.5 K/uL — ABNORMAL HIGH (ref 1.7–7.7)
Neutrophils Relative %: 74 %

## 2024-01-15 LAB — URINE DRUG SCREEN, QUALITATIVE (ARMC ONLY)
Amphetamines, Ur Screen: NOT DETECTED
Barbiturates, Ur Screen: NOT DETECTED
Benzodiazepine, Ur Scrn: NOT DETECTED
Cannabinoid 50 Ng, Ur ~~LOC~~: NOT DETECTED
Cocaine Metabolite,Ur ~~LOC~~: NOT DETECTED
MDMA (Ecstasy)Ur Screen: NOT DETECTED
Methadone Scn, Ur: NOT DETECTED
Opiate, Ur Screen: POSITIVE — AB
Phencyclidine (PCP) Ur S: NOT DETECTED
Tricyclic, Ur Screen: NOT DETECTED

## 2024-01-15 LAB — URINALYSIS, W/ REFLEX TO CULTURE (INFECTION SUSPECTED)
Bilirubin Urine: NEGATIVE
Glucose, UA: NEGATIVE mg/dL
Hgb urine dipstick: NEGATIVE
Ketones, ur: 5 mg/dL — AB
Nitrite: NEGATIVE
Protein, ur: 30 mg/dL — AB
Specific Gravity, Urine: 1.02 (ref 1.005–1.030)
pH: 5 (ref 5.0–8.0)

## 2024-01-15 LAB — CBC
HCT: 45.5 % (ref 36.0–46.0)
Hemoglobin: 12.8 g/dL (ref 12.0–15.0)
MCH: 28.7 pg (ref 26.0–34.0)
MCHC: 28.1 g/dL — ABNORMAL LOW (ref 30.0–36.0)
MCV: 102 fL — ABNORMAL HIGH (ref 80.0–100.0)
Platelets: 479 K/uL — ABNORMAL HIGH (ref 150–400)
RBC: 4.46 MIL/uL (ref 3.87–5.11)
RDW: 20.3 % — ABNORMAL HIGH (ref 11.5–15.5)
WBC: 18.1 K/uL — ABNORMAL HIGH (ref 4.0–10.5)
nRBC: 0.3 % — ABNORMAL HIGH (ref 0.0–0.2)

## 2024-01-15 LAB — PROTIME-INR
INR: 1.4 — ABNORMAL HIGH (ref 0.8–1.2)
Prothrombin Time: 18 s — ABNORMAL HIGH (ref 11.4–15.2)

## 2024-01-15 LAB — APTT: aPTT: 36 s (ref 24–36)

## 2024-01-15 LAB — CBG MONITORING, ED: Glucose-Capillary: 126 mg/dL — ABNORMAL HIGH (ref 70–99)

## 2024-01-15 LAB — TROPONIN I (HIGH SENSITIVITY): Troponin I (High Sensitivity): 59 ng/L — ABNORMAL HIGH (ref <18)

## 2024-01-15 LAB — LIPASE, BLOOD: Lipase: 38 U/L (ref 11–51)

## 2024-01-15 MED ORDER — SODIUM POLYSTYRENE SULFONATE 15 GM/60ML CO SUSP
30.0000 g | Freq: Once | Status: AC
  Administered 2024-01-15: 30 g via RECTAL
  Filled 2024-01-15: qty 120

## 2024-01-15 MED ORDER — NALOXONE HCL 2 MG/2ML IJ SOSY
PREFILLED_SYRINGE | INTRAMUSCULAR | Status: AC
  Administered 2024-01-15: 4 mg
  Filled 2024-01-15: qty 4

## 2024-01-15 MED ORDER — VASOPRESSIN 20 UNITS/100 ML INFUSION FOR SHOCK
0.0000 [IU]/min | INTRAVENOUS | Status: DC
  Administered 2024-01-15: 0.03 [IU]/min via INTRAVENOUS
  Filled 2024-01-15: qty 100

## 2024-01-15 MED ORDER — ALBUTEROL SULFATE (2.5 MG/3ML) 0.083% IN NEBU
10.0000 mg | INHALATION_SOLUTION | Freq: Once | RESPIRATORY_TRACT | Status: AC
  Administered 2024-01-15: 10 mg via RESPIRATORY_TRACT
  Filled 2024-01-15: qty 12

## 2024-01-15 MED ORDER — LACTATED RINGERS IV BOLUS
1000.0000 mL | Freq: Once | INTRAVENOUS | Status: AC
  Administered 2024-01-15: 1000 mL via INTRAVENOUS

## 2024-01-15 MED ORDER — MORPHINE SULFATE (PF) 2 MG/ML IV SOLN
1.0000 mg | INTRAVENOUS | Status: DC | PRN
  Administered 2024-01-15: 2 mg via INTRAVENOUS
  Administered 2024-01-16 (×3): 4 mg via INTRAVENOUS
  Filled 2024-01-15: qty 1
  Filled 2024-01-15 (×3): qty 2

## 2024-01-15 MED ORDER — VANCOMYCIN HCL 2000 MG/400ML IV SOLN
2000.0000 mg | Freq: Once | INTRAVENOUS | Status: DC
  Administered 2024-01-15: 2000 mg via INTRAVENOUS
  Filled 2024-01-15: qty 400

## 2024-01-15 MED ORDER — CALCIUM GLUCONATE-NACL 1-0.675 GM/50ML-% IV SOLN
1.0000 g | Freq: Once | INTRAVENOUS | Status: AC
  Administered 2024-01-15: 1000 mg via INTRAVENOUS
  Filled 2024-01-15: qty 50

## 2024-01-15 MED ORDER — PIPERACILLIN-TAZOBACTAM 3.375 G IVPB
3.3750 g | Freq: Once | INTRAVENOUS | Status: AC
  Administered 2024-01-15: 3.375 g via INTRAVENOUS
  Filled 2024-01-15: qty 50

## 2024-01-15 MED ORDER — SODIUM BICARBONATE 8.4 % IV SOLN
50.0000 meq | Freq: Once | INTRAVENOUS | Status: AC
  Administered 2024-01-15: 50 meq via INTRAVENOUS
  Filled 2024-01-15: qty 50

## 2024-01-15 MED ORDER — SODIUM CHLORIDE 0.9 % IV BOLUS
1000.0000 mL | Freq: Once | INTRAVENOUS | Status: AC
  Administered 2024-01-15: 1000 mL via INTRAVENOUS

## 2024-01-15 MED ORDER — NOREPINEPHRINE 4 MG/250ML-% IV SOLN
0.0000 ug/min | INTRAVENOUS | Status: DC
  Administered 2024-01-15: 15 ug/min via INTRAVENOUS
  Administered 2024-01-15 (×3): 40 ug/min via INTRAVENOUS
  Filled 2024-01-15 (×4): qty 250

## 2024-01-15 NOTE — ED Triage Notes (Signed)
 Pt BIB [facility] from [facility]. Pt was ortho apt and was lethargic, returned to facility and placed in bed when she became unresponsive. GCS 3. Hx Stroke. Presents to the ED on a NRB.  169 cbg 132/69  20 RR

## 2024-01-15 NOTE — ED Notes (Signed)
 NP Ouma at bedside updating family and talking about plan of care.

## 2024-01-15 NOTE — Code Documentation (Signed)
 CODE STROKE- PHARMACY COMMUNICATION   Time CODE STROKE called/page received:1508  Time response to CODE STROKE was made (in person): 1511  Time Stroke Kit retrieved from Pyxis:not required  Name of Provider/Nurse contacted: Voncile, MD  Past Medical History:  Diagnosis Date   Chronic kidney disease    Depression    Gout, joint    Hyperlipidemia, unspecified    Hypertension    Osteoarthritis of knee 07/14/2012   right   Overactive bladder    Stroke [facility])    Prior to Admission medications   Medication Sig Start Date End Date Taking? Authorizing Provider  acetaminophen  (TYLENOL ) 500 MG tablet Take 500 mg by mouth every 8 (eight) hours as needed.    [provider]  antiseptic oral rinse (BIOTENE) LIQD 10 mLs by Mouth Rinse route 4 (four) times daily as needed for dry mouth.    [provider]  ascorbic acid  (VITAMIN C ) 500 MG tablet Take 1 tablet (500 mg total) by mouth 2 (two) times daily. 01/05/24   Patel, Sona, MD  aspirin  EC 81 MG tablet Take 81 mg by mouth daily. At 8 am    [provider]  atorvastatin  (LIPITOR) 40 MG tablet Take 40 mg by mouth daily.    [provider]  ciclopirox  (LOPROX ) 0.77 % cream Apply 1 application  topically daily. 11/27/23   [provider]  diclofenac  Sodium (VOLTAREN  ARTHRITIS PAIN) 1 % GEL Apply 2 g topically 2 (two) times daily as needed.    [provider]  DULoxetine  (CYMBALTA ) 30 MG capsule Take 1 capsule (30 mg total) by mouth 2 (two) times daily. 07/17/17 01/03/24  Clapacs, Norleen DASEN, MD  feeding supplement (ENSURE PLUS HIGH PROTEIN) LIQD Take 237 mLs by mouth 2 (two) times daily between meals. 01/05/24   Patel, Sona, MD  fluocinonide cream (LIDEX) 0.05 % Apply 1 application topically 2 times daily as needed to eczema of left hand.    [provider]  fluticasone  (FLONASE ) 50 MCG/ACT nasal spray Place 2 sprays into both nostrils daily. 8 am    [provider]  furosemide (LASIX) 20  MG tablet Take 20 mg by mouth every other day. 8 am    [provider]  gabapentin  (NEURONTIN ) 300 MG capsule Take 300 mg by mouth 3 (three) times daily. 12/31/23   [provider]  guaiFENesin  (MUCINEX ) 600 MG 12 hr tablet Take by mouth 2 (two) times daily as needed.    [provider]  HYDROcodone -acetaminophen  (NORCO/VICODIN) 5-325 MG tablet Take 1 tablet by mouth every 6 (six) hours as needed. 01/05/24   Patel, Sona, MD  ipratropium-albuterol  (DUONEB) 0.5-2.5 (3) MG/3ML SOLN Take 3 mLs by nebulization every 6 (six) hours as needed.    [provider]  ketoconazole  (NIZORAL ) 2 % cream Apply 1 Application topically daily.    [provider]  lidocaine  (LIDODERM ) 5 % Place 1 patch onto the skin daily. At 8 am to right knee for pain. Remove patch at 8 pm. Remove & Discard patch within 12 hours or as directed by MD    [provider]  lisinopril  (PRINIVIL ,ZESTRIL ) 20 MG tablet Take 20 mg by mouth daily. At 8 am    [provider]  loratadine  (CLARITIN ) 10 MG tablet Take 10 mg by mouth daily.    [provider]  magnesium hydroxide (MILK OF MAGNESIA) 400 MG/5ML suspension Take 30 mLs by mouth See admin instructions. Constipation/no BM for 2 days, every 4 hours prn.  [provider]  melatonin 5 MG TABS Take 5 mg by mouth at bedtime.    [provider]  metFORMIN  (GLUCOPHAGE ) 500 MG tablet Take 500 mg by mouth 2 (two) times daily. 12/31/23   [provider]  Multiple Vitamin (MULTIVITAMIN WITH MINERALS) TABS tablet Take 1 tablet by mouth daily. 01/06/24   Patel, Sona, MD  nystatin  (MYCOSTATIN /NYSTOP ) powder Apply 1 Application topically 3 (three) times daily. 06/07/21   [provider]  polyethylene glycol (MIRALAX  / GLYCOLAX ) packet Take 17 g by mouth daily. Mix one tablespoon with 8oz of your favorite juice or water every day until you are having soft formed stools. Then start taking once daily if you  didn't have a stool the day before. 02/26/17   Lang Dover, MD  Polyvinyl Alcohol-Povidone PF (REFRESH) 1.4-0.6 % SOLN Place 2 drops into the right eye 4 (four) times daily. At 8 am, 1 pm, 5 pm, 8 pm    [provider]  senna-docusate (SENOKOT-S) 8.6-50 MG tablet Take 2 tablets by mouth at bedtime.    [provider]  tiZANidine  (ZANAFLEX ) 4 MG tablet Take 4 mg by mouth in the morning and at bedtime.    [provider]  traMADol  (ULTRAM ) 50 MG tablet Take 1 tablet (50 mg total) by mouth every 6 (six) hours as needed. 01/05/24   Patel, Sona, MD  traZODone  (DESYREL ) 50 MG tablet Take 50 mg by mouth at bedtime as needed for sleep.    [provider]  zinc  sulfate, 50mg  elemental zinc , 220 (50 Zn) MG capsule Take 1 capsule (220 mg total) by mouth daily. 01/06/24   Tobie Calix, MD    Adriana JONETTA Bolster ,PharmD Clinical Pharmacist  01/15/2024  3:13 PM

## 2024-01-15 NOTE — H&P (Signed)
 NAME:  Doris Curry, MRN:  [id number], DOB:  1946-02-05, LOS: 0 ADMISSION DATE:  01/15/2024 CHIEF COMPLAINT:  resp failure     History of Present Illness:   78 y.o. female PMH hypertension, hyperlipidemia, prior stroke, GERD, CKD, DM 2, morbid obesity, chronic venous insufficiency, bedbound at baseline brought in by ambulance for nonresponsiveness  - Brought in by ambulance after becoming unresponsive at an outpatient orthopedics appointment -Glucose normal.  Satting in the 60s on room air, is breathing spontaneously.  Not responsive to noxious stimuli.    DNR/DNI with limited interventions, intubation would not be within goals of care per paperwork bedside. - Patient is not responsive PATIENT IS IN THE DYING PROCESS   Per later chart review, recently admitted 01/02/2024-01/05/2024 after presenting with dysuria and confusion.  Found to have UTI, met sepsis criteria, hypotensive, responded to fluids.  Urine culture grew Klebsiella, discharged on Augmentin .    Also recently complained of neck pain, MRI of C-spine noted edema throughout the dens involving lateral masses of C1 and C2.    CT showed type II fracture of the dens with posterior and right lateral displacement of fracture fragment as well as lateral masses of C1.   Neurosurgery consulted, recommended conservative management with neck collar, as needed pain meds, outpatient management.   Note that she has been somewhat confused compared to baseline over the past few days. Patient has not been eating and also choking on food   Significant Hospital Events: Including procedures, antibiotic start and stop dates in addition to other pertinent events   7/11 seen in ER for multiorgan failure with pneumonia and UTI patient is in the dying process      Antimicrobials:   Antibiotics Given (last 72 hours)     Date/Time Action Medication Dose Rate   01/15/24 1648 New Bag/Given   piperacillin -tazobactam (ZOSYN ) IVPB 3.375 g 3.375 g  12.5 mL/hr           Objective   Blood pressure 101/78, pulse (!) 111, temperature (!) 95.7 F (35.4 C), resp. rate (!) 27.        Intake/Output Summary (Last 24 hours) at 01/15/2024 1759 Last data filed at 01/15/2024 1747 Gross per 24 hour  Intake 1000 ml  Output --  Net 1000 ml   There were no vitals filed for this visit.  REVIEW OF SYSTEMS  PATIENT IS UNABLE TO PROVIDE COMPLETE REVIEW OF SYSTEMS DUE TO SEVERE CRITICAL ILLNESS   PHYSICAL EXAMINATION:  GENERAL:critically ill appearing, +resp distress EYES: Pupils equal, round, reactive to light.  No scleral icterus.  MOUTH: Moist mucosal membrane.  NECK: Supple.  PULMONARY: Lungs clear to auscultation, +rhonchi, +wheezing CARDIOVASCULAR: S1 and S2.  Regular rate and rhythm GASTROINTESTINAL: Soft, nontender, -distended. Positive bowel sounds.  MUSCULOSKELETAL: No swelling, clubbing, or edema.  NEUROLOGIC: obtunded SKIN:normal, warm to touch, Capillary refill delayed  Feet in unaboots   Labs/imaging that I havepersonally reviewed  (right click and Reselect all SmartList Selections daily)  Lactic Acid, Venous    Component Value Date/Time   LATICACIDVEN 1.1 01/03/2024 1256        ASSESSMENT AND PLAN SYNOPSIS  Severe ACUTE Hypoxic and Hypercapnic Respiratory Failure due to aspiration pneumonia and septic shock with multiorgan failure and renal failure with metabolic acidosis and metabolic encephalopathy  PATIENT IS IN THE DYING PROCESS     CARDIAC ICU monitoring   ACUTE KIDNEY INJURY/Renal Failure -continue Foley Catheter-assess need -Avoid nephrotoxic agents -Follow urine output, BMP -Ensure adequate renal perfusion, optimize  oxygenation -Renal dose medications   Intake/Output Summary (Last 24 hours) at 01/15/2024 1759 Last data filed at 01/15/2024 1747 Gross per 24 hour  Intake 1000 ml  Output --  Net 1000 ml     NEUROLOGY Acute  metabolic encephalopathy, need for sedation Goal RASS  -2 to -3   SEPTIC SHOCK SOURCE-pneumonia and UTI -use vasopressors to keep MAP>65 as needed -follow ABG and LA -follow up cultures -emperic ABX -aggressive IV fluid resuscitation  INFECTIOUS DISEASE -continue antibiotics as prescribed -follow up cultures  ENDO - ICU hypoglycemic\Hyperglycemia protocol -check FSBS per protocol   GI GI PROPHYLAXIS as indicated  NUTRITIONAL STATUS DIET-->NPO Constipation protocol as indicated   ELECTROLYTES -follow labs as needed -replace as needed -pharmacy consultation and following     Best practice (right click and Reselect all SmartList Selections daily)  Diet:  NPO Mobility:  bed rest  Code Status: DNR/DNI Disposition: ICU  Labs   CBC: Recent Labs  Lab 01/15/24 1500  WBC 18.1*  NEUTROABS 13.5*  HGB 12.8  HCT 45.5  MCV 102.0*  PLT 479*    Basic Metabolic Panel: Recent Labs  Lab 01/15/24 1500  NA 138  K 6.6*  CL 100  CO2 11*  GLUCOSE 136*  BUN 52*  CREATININE 3.64*  CALCIUM  9.4   GFR: Estimated Creatinine Clearance: 13.8 mL/min (A) (by C-G formula based on SCr of 3.64 mg/dL (H)). Recent Labs  Lab 01/15/24 1500  WBC 18.1*    Liver Function Tests: Recent Labs  Lab 01/15/24 1500  AST 93*  ALT 37  ALKPHOS 173*  BILITOT 0.9  PROT 7.1  ALBUMIN  2.3*   Recent Labs  Lab 01/15/24 1500  LIPASE 38   No results for input(s): AMMONIA in the last 168 hours.  ABG    Component Value Date/Time   HCO3 10.5 (L) 01/15/2024 1530   ACIDBASEDEF 21.7 (H) 01/15/2024 1530   O2SAT 58.3 01/15/2024 1530     Coagulation Profile: Recent Labs  Lab 01/15/24 1500  INR 1.4*    Cardiac Enzymes: No results for input(s): CKTOTAL, CKMB, CKMBINDEX, TROPONINI in the last 168 hours.  HbA1C: Hgb A1c MFr Bld  Date/Time Value Ref Range Status  01/04/2024 04:07 PM 6.0 (H) 4.8 - 5.6 % Final    Comment:    (NOTE) Diagnosis of Diabetes The following HbA1c ranges recommended by the American Diabetes  Association (ADA) may be used as an aid in the diagnosis of diabetes mellitus.  Hemoglobin             Suggested A1C NGSP%              Diagnosis  <5.7                   Non Diabetic  5.7-6.4                Pre-Diabetic  >6.4                   Diabetic  <7.0                   Glycemic control for                       adults with diabetes.      CBG: No results for input(s): GLUCAP in the last 168 hours.  Allergies Allergies  Allergen Reactions   Citalopram     Other reaction(s): Blood Disorder Easy bruising   Duloxetine   Diarrhea   Bupropion Anxiety    150 mg.  Has tolerated 75       DVT/GI PRX  assessed I Assessed the need for Labs I Assessed the need for Foley I Assessed the need for Central Venous Line Family Discussion when available I Assessed the need for Mobilization I made an Assessment of medications to be adjusted accordingly Safety Risk assessment completed  CASE DISCUSSED IN MULTIDISCIPLINARY ROUNDS WITH ICU TEAM     Critical Care Time devoted to patient care services described in this note is 85 minutes.  Critical care was necessary to treat or prevent imminent or life-threatening deterioration.   PATIENT WITH VERY POOR PROGNOSIS I ANTICIPATE PROLONGED ICU LOS  Patient with Multiorgan failure and at high risk for cardiac arrest and death.    Nickolas Alm Cellar, M.D.  [facility]  Medical Director [facility] Medical Director [facility] Cardio-Pulmonary Department

## 2024-01-15 NOTE — Code Documentation (Signed)
 Stroke Response Nurse Documentation Code Documentation  Lyrick Worland is a 78 y.o. female arriving to [facility] via [facility] on 01/15/2024 with past medical hx of HTN, CKD, HLD, stroke. On No antithrombotic. Code stroke was activated by ED.   Patient from [facility] where she was LKW at unclear time and now presenting with unresponsiveness. Per primary RN, patient was lethargic at an ortho appointment earlier today. She returned to facility and was found to be unresponsive. Patient unresponsive on assessment.  Stroke team at the bedside on patient arrival. Labs drawn and patient cleared for CT by Dr. Michele. Patient to CT with team. NIHSS 30, see documentation for details and code stroke times. Patient with decreased LOC, disoriented, not following commands, bilateral arm weakness, bilateral leg weakness, bilateral decreased sensation, and Global aphasia  on exam. The following imaging was completed:  CT Head. Patient is not a candidate for IV Thrombolytic due to unclear LKW, per MD. Patient is not a candidate for IR due to contraindication, functional baseline status, per MD.   Care Plan: every 2 hour NIHSS, vital signs. Swallow screen per order.   Process Delays Noted: n/a  Bedside handoff with ED RN Norlene CANDIE Burnard KANDICE Hershel  Stroke Response RN

## 2024-01-15 NOTE — ED Notes (Signed)
 [facility] called for codestroke per Dr. Clarine Md. , spoke with Tinnie

## 2024-01-15 NOTE — ED Notes (Signed)
 Assisted RN with getting pt brief changed and cleaned up pt. Pillow provided for pt at this time.

## 2024-01-15 NOTE — IPAL (Addendum)
 INTERDISCIPLINARY GOALS OF CARE FAMILY MEETING    NAME: Doris Curry MRN: [id number] DOB : 15-Sep-1945 ATTENDING PHYSICIAN: Clarine Ozell LABOR, MD   Date carried out: 01/15/2024  Location of the meeting: Bedside   Member's involved: NP and Family Member or next of kin   Durable Power of Attorney or Environmental health practitioner:    Discussion:  Advance Care Planning/Goals of Care discussion was performed during the course of treatment to decide on type of care right for this patient who presented to the ED with severe sepsis with septic shock, acute hypoxic respiratory failure and altered mental status.   I met with patient's ADULT CHILDREN to discuss goals of care in details following  change in patient's current status. Reviewed patient's worsening lab, ABGs, vital signs including unstable HR and blood pressure requiring multiple pressors  and overall poor prognosis with the family at the bedside and answered all their question.   Discussed prognosis, expected outcome with or without ongoing aggressive treatments and the options for de-escalation of care.   Diagnosis(es): Sepsis with septic shock and multiorgan failure due to suspected UTI, AND Acute Hypoxic Respiratory Failure Secondary severe metabolic acidosis Prognosis: Poor Code Status: DNR/DNI Disposition: remains in ED Next Steps:  Family understands the situation. They have consented and agreed to DNR/DNI and would not wish to pursue any aggressive treatment.  Patient's family would like to proceed with full comfort care   Family are satisfied with Plan of action and management. All questions answered     Total Time Spent Face to Face addressing advance care planning in the presence of the Patient: 35 minutes       Almarie Nose, DNP, FNP-C, AGACNP-BC Acute Care Nurse Practitioner [facility] Pager: [phone] [place] at [facility]

## 2024-01-15 NOTE — ED Provider Notes (Signed)
 [facility] Provider Note    Event Date/Time   First MD Initiated Contact with Patient 01/15/24 1503     (approximate)   History   No chief complaint on file.  Pt BIB [facility] from [facility]. Pt was ortho apt and was lethargic, returned to facility and placed in bed when she became unresponsive. GCS 3. Hx Stroke. Presents to the ED on a NRB.  169 cbg 132/69  20 RR    HPI Doris Curry is a 78 y.o. female PMH hypertension, hyperlipidemia, prior stroke, GERD, CKD, DM 2, morbid obesity, chronic venous insufficiency, bedbound at baseline brought in by ambulance for nonresponsiveness - Brought in by ambulance after becoming unresponsive at an outpatient orthopedics appointment -Glucose normal.  Satting in the 60s on room air, is breathing spontaneously.  Not responsive to noxious stimuli.  DNR/DNI with limited interventions, intubation would not be within goals of care per paperwork bedside. - Patient is not responsive on my initial eval  Per later chart review, recently admitted 01/02/2024-01/05/2024 after presenting with dysuria and confusion.  Found to have UTI, met sepsis criteria, hypotensive, responded to fluids.  Urine culture grew Klebsiella, discharged on Augmentin .  Also recently complained of neck pain, MRI of C-spine noted edema throughout the dens involving lateral masses of C1 and C2.  CT showed type II fracture of the dens with posterior and right lateral displacement of fracture fragment as well as lateral masses of C1.  Neurosurgery consulted, recommended conservative management with neck collar, as needed pain meds, outpatient management.  Collateral later gathered from family.  Note that she has been somewhat confused compared to baseline over the past few days.     Physical Exam   Triage Vital Signs: ED Triage Vitals [01/15/24 1456]  Encounter Vitals Group     BP (!) 138/52     Girls Systolic BP Percentile      Girls Diastolic BP Percentile       Boys Systolic BP Percentile      Boys Diastolic BP Percentile      Pulse Rate (!) 111     Resp (!) 21     Temp      Temp src      SpO2      Weight      Height      Head Circumference      Peak Flow      Pain Score      Pain Loc      Pain Education      Exclude from Growth Chart     Most recent vital signs: Vitals:   01/15/24 2000 01/15/24 2100  BP: (!) 50/20 (!) 48/36  Pulse:    Resp: (!) 23 (!) 22  Temp: (!) 95.8 F (35.4 C) (!) 95.9 F (35.5 C)   General: Nonresponsive, very ill-appearing CV:  Good peripheral perfusion.  Tachycardic, regular rhythm Resp:  Breathing spontaneously, mild tachypnea, CTAB Abd:  No distention. Nontender to deep palpation throughout Neuro:  Nonresponsive to painful stimuli in all extremities.  Pupils 3 mm bilaterally, nonresponsive. Other:  Created skin and skin folds around vagina extending up towards sacrum though no significant warmth, no erythema, no purulence.   ED Results / Procedures / Treatments   Labs (all labs ordered are listed, but only abnormal results are displayed) Labs Reviewed  BLOOD GAS, VENOUS - Abnormal; Notable for the following components:      Result Value   pH, Ven <6.95 (*)  Bicarbonate 10.5 (*)    Acid-base deficit 21.7 (*)    All other components within normal limits  PROTIME-INR - Abnormal; Notable for the following components:   Prothrombin Time 18.0 (*)    INR 1.4 (*)    All other components within normal limits  CBC - Abnormal; Notable for the following components:   WBC 18.1 (*)    MCV 102.0 (*)    MCHC 28.1 (*)    RDW 20.3 (*)    Platelets 479 (*)    nRBC 0.3 (*)    All other components within normal limits  DIFFERENTIAL - Abnormal; Notable for the following components:   Neutro Abs 13.5 (*)    Monocytes Absolute 1.1 (*)    Abs Immature Granulocytes 0.46 (*)    All other components within normal limits  COMPREHENSIVE METABOLIC PANEL WITH GFR - Abnormal; Notable for the following  components:   Potassium 6.6 (*)    CO2 11 (*)    Glucose, Bld 136 (*)    BUN 52 (*)    Creatinine, Ser 3.64 (*)    Albumin  2.3 (*)    AST 93 (*)    Alkaline Phosphatase 173 (*)    GFR, Estimated 12 (*)    Anion gap 27 (*)    All other components within normal limits  URINE DRUG SCREEN, QUALITATIVE ([facility] ONLY) - Abnormal; Notable for the following components:   Opiate, Ur Screen POSITIVE (*)    All other components within normal limits  URINALYSIS, W/ REFLEX TO CULTURE (INFECTION SUSPECTED) - Abnormal; Notable for the following components:   Color, Urine AMBER (*)    APPearance CLOUDY (*)    Ketones, ur 5 (*)    Protein, ur 30 (*)    Leukocytes,Ua LARGE (*)    Bacteria, UA MANY (*)    All other components within normal limits  CBG MONITORING, ED - Abnormal; Notable for the following components:   Glucose-Capillary 126 (*)    All other components within normal limits  TROPONIN I (HIGH SENSITIVITY) - Abnormal; Notable for the following components:   Troponin I (High Sensitivity) 59 (*)    All other components within normal limits  URINE CULTURE  APTT  LIPASE, BLOOD     EKG  Ecg = sinus tach, 108, no gross ST elevation, trace ST elevations noted in lead III though this appears similar to prior EKG from earlier this month.   RADIOLOGY Radiology interpreted by myself and radiology report reviewed.  CT head unremarkable.  CT chest abdomen pelvis with possible pneumonia.  Possible abscess also noticed in the gluteal cleft.    PROCEDURES:  Critical Care performed: Yes, see critical care procedure note(s)  .Critical Care  Performed by: Clarine Ozell LABOR, MD Authorized by: Clarine Ozell LABOR, MD   Critical care provider statement:    Critical care time (minutes):  60   Critical care time was exclusive of:  Separately billable procedures and treating other patients   Critical care was necessary to treat or prevent imminent or life-threatening deterioration of the following  conditions:  Circulatory failure, respiratory failure, shock, sepsis and renal failure   Critical care was time spent personally by me on the following activities:  Development of treatment plan with patient or surrogate, discussions with consultants, evaluation of patient's response to treatment, examination of patient, ordering and review of laboratory studies, ordering and review of radiographic studies, ordering and performing treatments and interventions, pulse oximetry, re-evaluation of patient's condition and review of  old charts   I assumed direction of critical care for this patient from another provider in my specialty: no     Care discussed with: admitting provider      MEDICATIONS ORDERED IN ED: Medications  morphine  (PF) 2 MG/ML injection 1-4 mg (has no administration in time range)  lactated ringers  bolus 1,000 mL (0 mLs Intravenous Stopped 01/15/24 1747)  naloxone  (NARCAN ) 2 MG/2ML injection (4 mg  Given 01/15/24 0318)  lactated ringers  bolus 1,000 mL (0 mLs Intravenous Stopped 01/15/24 2031)  calcium  gluconate 1 g/ 50 mL sodium chloride  IVPB (0 mg Intravenous Stopped 01/15/24 2031)  sodium polystyrene (KAYEXALATE ) 15 GM/60ML suspension 30 g (30 g Rectal Given 01/15/24 1622)  albuterol  (PROVENTIL ) (2.5 MG/3ML) 0.083% nebulizer solution 10 mg (10 mg Nebulization Given 01/15/24 1608)  sodium bicarbonate  injection 50 mEq (50 mEq Intravenous Given 01/15/24 1607)  sodium chloride  0.9 % bolus 1,000 mL (0 mLs Intravenous Stopped 01/15/24 1843)  piperacillin -tazobactam (ZOSYN ) IVPB 3.375 g (0 g Intravenous Stopped 01/15/24 1847)     IMPRESSION / MDM / ASSESSMENT AND PLAN / ED COURSE  I reviewed the triage vital signs and the nursing notes.                              DDX/MDM/AP: Differential diagnosis includes, but is not limited to, stroke/intracranial hemorrhage, infectious or metabolic encephalopathy, presentation not typical of opiate overdose given no pinpoint pupils and tachypneic,  no hypoglycemia on check by EMS.  Patient nonresponsive, reportedly became acutely unresponsive while in clinic today--code stroke activated, last known normal about 1 hour.  Neurology evaluated patient, would not be interventional nor TNK candidate given baseline bedbound and her critically ill status --no acute management recommended from their perspective.  GCS 3.  Patient does have paperwork confirming DNR/DNI status--will defer intubation and maintain on nonrebreather.  Patient is critically ill, anticipate ICU admission if within goals of care.  Plan: - Code stroke - CT head - Cardiac monitor - EKG - Labs - Chest x-ray - Clarify goals of care with family  Patient's presentation is most consistent with acute presentation with potential threat to life or bodily function.  The patient is on the cardiac monitor to evaluate for evidence of arrhythmia and/or significant heart rate changes.  ED course below.  Workup notable for significant leukocytosis as well as AKI on CKD with hyperkalemia.  No EKG changes appreciated.  Notably acidotic with a pH less than 6.95.  Urinalysis consistent with infection.  Patient is critically ill here and remains fully obtunded, nonresponsive to noxious stimuli, no corneal reflex though does have an intact gag reflex.  Code stroke with no acute pathology.  Escalated to CT chest abdomen pelvis which did show possible pneumonia as well as possibility of gluteal cleft abscess.  In discussion with family including HPOA, confirmed DNR/DNI status but okay with limited interventions including vasopressors.  Started on vasopressin  and Levophed , poorly responsive in addition to progressive fluid boluses.  Broad spectrum antibiotics initiated and hyperkalemia medications administered.  Explained in detail patient's very poor clinical prognosis and that I believe she may be a process of dying.  Discussed with ICU who agrees the patient is critically ill and has an incredibly  poor prognosis--continue goals of care discussions with family to determine whether further interventions warranted or consider transition to comfort care.  Clinical Course as of 01/15/24 2225  Fri Jan 15, 2024  1530 Spoke w/ daughter, HPOA,  Scarlette Shilling (Daughter) [phone] (Home Phone)   Confirms DNR/DNI Pressors are within GOC [MM]  [phone] Other daughter notes pt was delirious, out of it Glucose 169  Is taking narcotics  Last seen Weds night, normal at that time  Tues they were considering feeding tube  Mother was yelling out asking for help today, stating she was in pain. Very agitated    Other sister saw mother at nursing home last night - was mumbling   [MM]  1715 Urinalysis concerning for infection [MM]  1716 Paging ICU [MM]  1725 D/w Dr. Delma of ICU Will speak with family further re: GOC [MM]  1820 CT CAP: IMPRESSION: 1. Small left greater than right pleural effusions. Peribronchovascular airspace opacities in the lower lobes, suspicious pneumonia and/or aspiration. 2. Fluid column filling the esophagus reaching the thoracic inlet. 3. Small pericardial effusion is new since 2018. 4. Soft tissue thickening along the gluteal cleft with 3.4 cm fluid collection in the medial right buttock suspicious for abscess. 5. Aortic Atherosclerosis (ICD10-I70.0).   [MM]    Clinical Course User Index [MM] Clarine Ozell LABOR, MD     FINAL CLINICAL IMPRESSION(S) / ED DIAGNOSES   Final diagnoses:  Shock (HCC)  Altered mental status, unspecified altered mental status type  Urinary tract infection with hematuria, site unspecified  Pneumonia due to infectious organism, unspecified laterality, unspecified part of lung     Rx / DC Orders   ED Discharge Orders     None        Note:  This document was prepared using Dragon voice recognition software and may include unintentional dictation errors.   Clarine Ozell LABOR, MD 01/15/24 2225

## 2024-01-15 NOTE — ED Notes (Signed)
 This RN to bedside to hang medications. Pt's eyes were open and moving. Rn was unable to get any other response from pt.

## 2024-01-15 NOTE — Progress Notes (Deleted)
 Referring Physician:  No referring provider defined for this encounter.  Primary Physician:  Ky Pizza [facility]  History of Present Illness: 01/15/2024 [name] is here today with a chief complaint of ***  Neck pain Arm pain or weakness?  Duration: *** Location: *** Quality: *** Severity: ***  Precipitating: aggravated by *** Modifying factors: made better by *** Weakness: none Timing: *** Bowel/Bladder Dysfunction: none  Conservative measures:  Physical therapy: *** has not participated in ? Multimodal medical therapy including regular antiinflammatories: *** Norco, Tizanidine , Tylenol , Voltaren , Gabapentin , Lidocaine  patches, Tramadol  Injections: *** no epidural steroid injections?  Past Surgery: *** no spine or neck surgeries?  Doris Curry has ***no symptoms of cervical myelopathy.  The symptoms are causing a significant impact on the patient's life.   Review of Systems:  A 10 point review of systems is negative, except for the pertinent positives and negatives detailed in the HPI.  Past Medical History: Past Medical History:  Diagnosis Date   Chronic kidney disease    Depression    Gout, joint    Hyperlipidemia, unspecified    Hypertension    Osteoarthritis of knee 07/14/2012   right   Overactive bladder    Stroke [facility])     Past Surgical History: Past Surgical History:  Procedure Laterality Date   ABDOMINAL HYSTERECTOMY  1987   partial   MASTECTOMY PARTIAL / LUMPECTOMY      Allergies: Allergies as of 01/20/2024 - Review Complete 01/04/2024  Allergen Reaction Noted   Citalopram  04/05/2014   Duloxetine  Diarrhea 08/19/2017   Bupropion Anxiety 05/05/2014    Medications: Outpatient Encounter Medications as of 01/20/2024  Medication Sig   acetaminophen  (TYLENOL ) 500 MG tablet Take 500 mg by mouth every 8 (eight) hours as needed.   antiseptic oral rinse (BIOTENE) LIQD 10 mLs by Mouth Rinse route 4 (four) times daily as needed  for dry mouth.   ascorbic acid  (VITAMIN C ) 500 MG tablet Take 1 tablet (500 mg total) by mouth 2 (two) times daily.   aspirin  EC 81 MG tablet Take 81 mg by mouth daily. At 8 am   atorvastatin  (LIPITOR) 40 MG tablet Take 40 mg by mouth daily.   ciclopirox  (LOPROX ) 0.77 % cream Apply 1 application  topically daily.   diclofenac  Sodium (VOLTAREN  ARTHRITIS PAIN) 1 % GEL Apply 2 g topically 2 (two) times daily as needed.   DULoxetine  (CYMBALTA ) 30 MG capsule Take 1 capsule (30 mg total) by mouth 2 (two) times daily.   feeding supplement (ENSURE PLUS HIGH PROTEIN) LIQD Take 237 mLs by mouth 2 (two) times daily between meals.   fluocinonide cream (LIDEX) 0.05 % Apply 1 application topically 2 times daily as needed to eczema of left hand.   fluticasone  (FLONASE ) 50 MCG/ACT nasal spray Place 2 sprays into both nostrils daily. 8 am   furosemide (LASIX) 20 MG tablet Take 20 mg by mouth every other day. 8 am   gabapentin  (NEURONTIN ) 300 MG capsule Take 300 mg by mouth 3 (three) times daily.   guaiFENesin  (MUCINEX ) 600 MG 12 hr tablet Take by mouth 2 (two) times daily as needed.   HYDROcodone -acetaminophen  (NORCO/VICODIN) 5-325 MG tablet Take 1 tablet by mouth every 6 (six) hours as needed.   ipratropium-albuterol  (DUONEB) 0.5-2.5 (3) MG/3ML SOLN Take 3 mLs by nebulization every 6 (six) hours as needed.   ketoconazole  (NIZORAL ) 2 % cream Apply 1 Application topically daily.   lidocaine  (LIDODERM ) 5 % Place 1 patch onto the skin daily.  At 8 am to right knee for pain. Remove patch at 8 pm. Remove & Discard patch within 12 hours or as directed by MD   lisinopril  (PRINIVIL ,ZESTRIL ) 20 MG tablet Take 20 mg by mouth daily. At 8 am   loratadine  (CLARITIN ) 10 MG tablet Take 10 mg by mouth daily.   magnesium hydroxide (MILK OF MAGNESIA) 400 MG/5ML suspension Take 30 mLs by mouth See admin instructions. Constipation/no BM for 2 days, every 4 hours prn.   melatonin 5 MG TABS Take 5 mg by mouth at bedtime.   metFORMIN   (GLUCOPHAGE ) 500 MG tablet Take 500 mg by mouth 2 (two) times daily.   Multiple Vitamin (MULTIVITAMIN WITH MINERALS) TABS tablet Take 1 tablet by mouth daily.   nystatin  (MYCOSTATIN /NYSTOP ) powder Apply 1 Application topically 3 (three) times daily.   polyethylene glycol (MIRALAX  / GLYCOLAX ) packet Take 17 g by mouth daily. Mix one tablespoon with 8oz of your favorite juice or water every day until you are having soft formed stools. Then start taking once daily if you didn't have a stool the day before.   Polyvinyl Alcohol-Povidone PF (REFRESH) 1.4-0.6 % SOLN Place 2 drops into the right eye 4 (four) times daily. At 8 am, 1 pm, 5 pm, 8 pm   senna-docusate (SENOKOT-S) 8.6-50 MG tablet Take 2 tablets by mouth at bedtime.   tiZANidine  (ZANAFLEX ) 4 MG tablet Take 4 mg by mouth in the morning and at bedtime.   traMADol  (ULTRAM ) 50 MG tablet Take 1 tablet (50 mg total) by mouth every 6 (six) hours as needed.   traZODone  (DESYREL ) 50 MG tablet Take 50 mg by mouth at bedtime as needed for sleep.   zinc  sulfate, 50mg  elemental zinc , 220 (50 Zn) MG capsule Take 1 capsule (220 mg total) by mouth daily.   No facility-administered encounter medications on file as of 01/20/2024.    Social History: Social History   Tobacco Use   Smoking status: Former    Current packs/day: 0.00    Average packs/day: 1.5 packs/day for 20.0 years (30.0 ttl pk-yrs)    Types: Cigarettes    Start date: 04/23/1982    Quit date: 04/23/2002    Years since quitting: 21.7   Smokeless tobacco: Never  Vaping Use   Vaping status: Never Used  Substance Use Topics   Alcohol use: No   Drug use: No    Family Medical History: Family History  Problem Relation Age of Onset   Breast cancer Mother    Alzheimer's disease Mother    Hypertension Father    Kidney disease Father    Stroke Father    Stroke Paternal Grandfather    Diabetes type II Paternal Grandfather    Diabetes type II Paternal Aunt    Diabetes type II Daughter      Physical Examination: @VITALWITHPAIN @  General: Patient is well developed, well nourished, calm, collected, and in no apparent distress. Attention to examination is appropriate.  Psychiatric: Patient is non-anxious.  Head:  Pupils equal, round, and reactive to light.  ENT:  Oral mucosa appears well hydrated.  Neck:   Supple.  ***Full range of motion.  Respiratory: Patient is breathing without any difficulty.  Extremities: No edema.  Vascular: Palpable dorsal pedal pulses.  Skin:   On exposed skin, there are no abnormal skin lesions.  NEUROLOGICAL:     Awake, alert, oriented to person, place, and time.  Speech is clear and fluent. Fund of knowledge is appropriate.   Cranial Nerves: Pupils equal round  and reactive to light.  Facial tone is symmetric.  Facial sensation is symmetric.  ROM of spine: ***full.  Palpation of spine: ***non tender.    Strength: Side Biceps Triceps Deltoid Interossei Grip Wrist Ext. Wrist Flex.  R 5 5 5 5 5 5 5   L 5 5 5 5 5 5 5    Side Iliopsoas Quads Hamstring PF DF EHL  R 5 5 5 5 5 5   L 5 5 5 5 5 5    Reflexes are ***2+ and symmetric at the biceps, triceps, brachioradialis, patella and achilles.   Hoffman's is absent.  Clonus is not present.  Toes are down-going.  Bilateral upper and lower extremity sensation is intact to light touch.    Gait is normal.   No difficulty with tandem gait.   No evidence of dysmetria noted.  Medical Decision Making  Imaging: ***  I have personally reviewed the images and agree with the above interpretation.  Assessment and Plan: Ms. Vanvleck is a pleasant 78 y.o. female with ***    Thank you for involving me in the care of this patient.   I spent a total of *** minutes in both face-to-face and non-face-to-face activities for this visit on the date of this encounter.   Lyle Decamp, PA-C Dept. of Neurosurgery

## 2024-01-15 NOTE — ED Notes (Signed)
 4MG  Narcan  given via IV

## 2024-01-15 NOTE — Consult Note (Signed)
 NEUROLOGY CONSULT NOTE   Date of service: January 15, 2024 Patient Name: Doris Curry MRN:  [id number] DOB:  09-19-1945 Chief Complaint: Code stroke for unresponsiveness Requesting Provider: Clarine Ozell LABOR, MD  History of Present Illness  Doris Curry is a 78 y.o. female with hx of prior stroke in 2003 with what sounds like residual right spastic hemiparesis, who is nearly bedbound at baseline-hypertension, hyperlipidemia, was brought in from [facility] for evaluation of unresponsiveness.  They reported that patient earlier in the day was at an orthopedics appointment where she was lethargic and upon returning to the facility, she was placed in bed and became unresponsive.  GCS was 3.  She was on a nonrebreather mask when she presented to the ED.  The ED provider evaluated her and activated a code stroke given her prior history of strokes as well as nonreactive pupils.  Of note, patient was in the hospital and discharged on 01/05/2024 after treatment of sepsis, AKI  LKW: Unclear Modified rankin score: 5-Severe disability-bedridden, incontinent, needs constant attention IV Thrombolysis: Unclear last known well EVT: Advanced imaging not performed.  Her baseline functional debility precludes her from EVT NIH stroke scale 30  ROS  Unable to ascertain due to her unresponsiveness  Past History   Past Medical History:  Diagnosis Date   Chronic kidney disease    Depression    Gout, joint    Hyperlipidemia, unspecified    Hypertension    Osteoarthritis of knee 07/14/2012   right   Overactive bladder    Stroke [facility])     Past Surgical History:  Procedure Laterality Date   ABDOMINAL HYSTERECTOMY  1987   partial   MASTECTOMY PARTIAL / LUMPECTOMY      Family History: Family History  Problem Relation Age of Onset   Breast cancer Mother    Alzheimer's disease Mother    Hypertension Father    Kidney disease Father    Stroke Father    Stroke Paternal Grandfather    Diabetes type  II Paternal Grandfather    Diabetes type II Paternal Aunt    Diabetes type II Daughter     Social History  reports that she quit smoking about 21 years ago. Her smoking use included cigarettes. She started smoking about 41 years ago. She has a 30 pack-year smoking history. She has never used smokeless tobacco. She reports that she does not drink alcohol and does not use drugs.  Allergies  Allergen Reactions   Citalopram     Other reaction(s): Blood Disorder Easy bruising   Duloxetine  Diarrhea   Bupropion Anxiety    150 mg.  Has tolerated 75    Medications   Current Facility-Administered Medications:    calcium  gluconate 1 g/ 50 mL sodium chloride  IVPB, 1 g, Intravenous, Once, Clarine Ozell LABOR, MD   lactated ringers  bolus 1,000 mL, 1,000 mL, Intravenous, Once, Clarine Ozell LABOR, MD   norepinephrine  (LEVOPHED ) 4mg  in (0.016 mg/mL) premix infusion, 0-40 mcg/min, Intravenous, Continuous, Mian, Ozell LABOR, MD   sodium chloride  0.9 % bolus 1,000 mL, 1,000 mL, Intravenous, Once, Clarine Ozell LABOR, MD   sodium polystyrene (KAYEXALATE ) 15 GM/60ML suspension 30 g, 30 g, Rectal, Once, Clarine Ozell LABOR, MD   vasopressin  (PITRESSIN) 20 Units in 100 mL (0.2 unit/mL) infusion-*FOR SHOCK*, 0-0.03 Units/min, Intravenous, Continuous, Clarine Ozell LABOR, MD, Last Rate: 9 mL/hr at 01/15/24 1603, 0.03 Units/min at 01/15/24 1603  Current Outpatient Medications:    acetaminophen  (TYLENOL ) 500 MG tablet, Take 500 mg by  mouth every 8 (eight) hours as needed., Disp: , Rfl:    antiseptic oral rinse (BIOTENE) LIQD, 10 mLs by Mouth Rinse route 4 (four) times daily as needed for dry mouth., Disp: , Rfl:    ascorbic acid  (VITAMIN C ) 500 MG tablet, Take 1 tablet (500 mg total) by mouth 2 (two) times daily., Disp: 60 tablet, Rfl: 0   aspirin  EC 81 MG tablet, Take 81 mg by mouth daily. At 8 am, Disp: , Rfl:    atorvastatin  (LIPITOR) 40 MG tablet, Take 40 mg by mouth daily., Disp: , Rfl:    ciclopirox  (LOPROX ) 0.77 %  cream, Apply 1 application  topically daily., Disp: , Rfl:    diclofenac  Sodium (VOLTAREN  ARTHRITIS PAIN) 1 % GEL, Apply 2 g topically 2 (two) times daily as needed., Disp: , Rfl:    DULoxetine  (CYMBALTA ) 30 MG capsule, Take 1 capsule (30 mg total) by mouth 2 (two) times daily., Disp: 60 capsule, Rfl: 2   feeding supplement (ENSURE PLUS HIGH PROTEIN) LIQD, Take 237 mLs by mouth 2 (two) times daily between meals., Disp: 237 mL, Rfl: 0   fluocinonide cream (LIDEX) 0.05 %, Apply 1 application topically 2 times daily as needed to eczema of left hand., Disp: , Rfl:    fluticasone  (FLONASE ) 50 MCG/ACT nasal spray, Place 2 sprays into both nostrils daily. 8 am, Disp: , Rfl:    furosemide (LASIX) 20 MG tablet, Take 20 mg by mouth every other day. 8 am, Disp: , Rfl:    gabapentin  (NEURONTIN ) 300 MG capsule, Take 300 mg by mouth 3 (three) times daily., Disp: , Rfl:    guaiFENesin  (MUCINEX ) 600 MG 12 hr tablet, Take by mouth 2 (two) times daily as needed., Disp: , Rfl:    HYDROcodone -acetaminophen  (NORCO/VICODIN) 5-325 MG tablet, Take 1 tablet by mouth every 6 (six) hours as needed., Disp: 10 tablet, Rfl: 0   ipratropium-albuterol  (DUONEB) 0.5-2.5 (3) MG/3ML SOLN, Take 3 mLs by nebulization every 6 (six) hours as needed., Disp: , Rfl:    ketoconazole  (NIZORAL ) 2 % cream, Apply 1 Application topically daily., Disp: , Rfl:    lidocaine  (LIDODERM ) 5 %, Place 1 patch onto the skin daily. At 8 am to right knee for pain. Remove patch at 8 pm. Remove & Discard patch within 12 hours or as directed by MD, Disp: , Rfl:    lisinopril  (PRINIVIL ,ZESTRIL ) 20 MG tablet, Take 20 mg by mouth daily. At 8 am, Disp: , Rfl:    loratadine  (CLARITIN ) 10 MG tablet, Take 10 mg by mouth daily., Disp: , Rfl:    magnesium hydroxide (MILK OF MAGNESIA) 400 MG/5ML suspension, Take 30 mLs by mouth See admin instructions. Constipation/no BM for 2 days, every 4 hours prn., Disp: , Rfl:    melatonin 5 MG TABS, Take 5 mg by mouth at bedtime.,  Disp: , Rfl:    metFORMIN  (GLUCOPHAGE ) 500 MG tablet, Take 500 mg by mouth 2 (two) times daily., Disp: , Rfl:    Multiple Vitamin (MULTIVITAMIN WITH MINERALS) TABS tablet, Take 1 tablet by mouth daily., Disp: 30 tablet, Rfl: 0   nystatin  (MYCOSTATIN /NYSTOP ) powder, Apply 1 Application topically 3 (three) times daily., Disp: , Rfl:    polyethylene glycol (MIRALAX  / GLYCOLAX ) packet, Take 17 g by mouth daily. Mix one tablespoon with 8oz of your favorite juice or water every day until you are having soft formed stools. Then start taking once daily if you didn't have a stool the day before., Disp: 30 each, Rfl: 0  Polyvinyl Alcohol-Povidone PF (REFRESH) 1.4-0.6 % SOLN, Place 2 drops into the right eye 4 (four) times daily. At 8 am, 1 pm, 5 pm, 8 pm, Disp: , Rfl:    senna-docusate (SENOKOT-S) 8.6-50 MG tablet, Take 2 tablets by mouth at bedtime., Disp: , Rfl:    tiZANidine  (ZANAFLEX ) 4 MG tablet, Take 4 mg by mouth in the morning and at bedtime., Disp: , Rfl:    traMADol  (ULTRAM ) 50 MG tablet, Take 1 tablet (50 mg total) by mouth every 6 (six) hours as needed., Disp: 15 tablet, Rfl: 0   traZODone  (DESYREL ) 50 MG tablet, Take 50 mg by mouth at bedtime as needed for sleep., Disp: , Rfl:    zinc  sulfate, 50mg  elemental zinc , 220 (50 Zn) MG capsule, Take 1 capsule (220 mg total) by mouth daily., Disp: 30 capsule, Rfl: 0  Vitals   Vitals:   18-Jan-2024 1456 01/18/24 1530 01/18/2024 1547  BP: (!) 138/52 (!) 118/100 (!) 52/32  Pulse: (!) 111    Resp: (!) 21 19 (!) 27    There is no height or weight on file to calculate BMI.   Physical Exam   General: Appears comatose HEENT: Normocephalic atraumatic Lungs: Distant breath sounds CVS: Irregularly rhythm Neurological exam Appears comatose No response to voice No response to noxious stimulation Pupils were 2 mm equal and nonreactive. Narcan  was given after which pupils became 5 mm round equal and nonreactive Corneal reflexes were absent Weak gag  reflex was present To noxious stimulation, no movement in bilateral upper or lower extremities GCS 3  Labs/Imaging/Neurodiagnostic studies   CBC:  Recent Labs  Lab 2024-01-18 1500  WBC 18.1*  NEUTROABS 13.5*  HGB 12.8  HCT 45.5  MCV 102.0*  PLT 479*   Basic Metabolic Panel:  Lab Results  Component Value Date   NA 138 2024/01/18   K 6.6 (HH) January 18, 2024   CO2 11 (L) 01/18/2024   GLUCOSE 136 (H) 01-18-24   BUN 52 (H) 01-18-24   CREATININE 3.64 (H) 01-18-24   CALCIUM  9.4 2024-01-18   GFRNONAA 12 (L) Jan 18, 2024   GFRAA 47 (L) 07/17/2017   HgbA1c:  Lab Results  Component Value Date   HGBA1C 6.0 (H) 01/04/2024   INR  Lab Results  Component Value Date   INR 1.4 (H) Jan 18, 2024   APTT  Lab Results  Component Value Date   APTT 36 Jan 18, 2024    CT Head without contrast(Personally reviewed): No acute findings     ASSESSMENT   Lona Six is a 78 y.o. female prior history of stroke in 2003 with residual right spastic hemiparesis nearly bedbound at this time, hypertension, hyperlipidemia brought in from her facility for evaluation of sudden onset unresponsiveness.  She was lethargic throughout the day, especially at her orthopedics appointment and upon returning back to the facility became unresponsive with a GCS 3.  No focal findings other than unreactive pupils bilaterally. Has a history of opiate medication prescription-Narcan  was given.  No change in exam but pupils dilated to 5 mm bilaterally and still unresponsive.  No corneal reflexes.  Has a gag reflex. DNR/DNI at baseline I do not think this is a stroke-this might be something more related to a toxic metabolic etiology since she was admitted recently for sepsis and AKI and again today has leukocytosis to the tune of 18,000 She is extremely acidotic on the VBG with pH less than 6.9 bicarb 10.5, normal pCO2 and O2.  Impression: Acute unresponsiveness-likely toxic metabolic encephalopathy, less likely stroke.  Not a candidate for acute stroke interventions-reasons isted above.  RECOMMENDATIONS  Management per the ER Consider MRI of the brain to rule out a stroke but that does not change any of the initial management Workup for sepsis-UA chest x-ray She is a DNR/DNI and currently is extremely acidotic.  She might be in the active process of dying.  Consider palliative care consultation.  Plan discussed with Dr. Clarine ______________________________________________________________________    Signed, Eligio Lav, MD [facility]   CRITICAL CARE ATTESTATION Performed by: Eligio Lav, MD Total critical care time: 40 minutes Critical care time was exclusive of separately billable procedures and treating other patients and/or supervising APPs/Residents/Students Critical care was necessary to treat or prevent imminent or life-threatening deterioration. This patient is critically ill and at significant risk for neurological worsening and/or death and care requires constant monitoring. Critical care was time spent personally by me on the following activities: development of treatment plan with patient and/or surrogate as well as nursing, discussions with consultants, evaluation of patient's response to treatment, examination of patient, obtaining history from patient or surrogate, ordering and performing treatments and interventions, ordering and review of laboratory studies, ordering and review of radiographic studies, pulse oximetry, re-evaluation of patient's condition, participation in multidisciplinary rounds and medical decision making of high complexity in the care of this patient.

## 2024-01-15 NOTE — ED Notes (Signed)
 This RN and 3 other RN'S attempted IV sticks with no success. IV team consulted.

## 2024-01-15 NOTE — Progress Notes (Signed)
 ED Pharmacy Antibiotic Sign Off An antibiotic consult was received from an ED provider for vancomycin  per pharmacy dosing for sepsis. A chart review was completed to assess appropriateness.   The following one time order(s) were placed:   vancomycin  2000 mg IV x 1  Further antibiotic and/or antibiotic pharmacy consults should be ordered by the admitting provider if indicated.   Thank you for allowing pharmacy to be a part of this patient's care.   Adriana JONETTA Bolster, [facility]  Clinical Pharmacist 01/15/24 4:38 PM

## 2024-01-15 NOTE — ED Notes (Signed)
 NP Ouma discussed plan of care with family. She instructed this RN to keep pt on pressor medications until all family was present and ready to discontinue medication administration.

## 2024-01-15 NOTE — Progress Notes (Addendum)
   01/15/24 1700  Spiritual Encounters  Type of Visit Initial  Care provided to: Family  Referral source Code page  Reason for visit Code  OnCall Visit Yes  Interventions  Spiritual Care Interventions Made Established relationship of care and support;Compassionate presence  Intervention Outcomes  Outcomes Connection to spiritual care   Chaplain provided compassionate presence and support to daughter.

## 2024-01-15 NOTE — IPAL (Signed)
  Interdisciplinary Goals of Care Family Meeting   Date carried out: 01/15/2024  Location of the meeting: Phone conference  Member's involved: Physician, Bedside Registered Nurse, and Family Member or next of kin    GOALS OF CARE DISCUSSION  The Clinical status was relayed to family in detail-Daughter Doris Curry [facility]  Updated and notified of patients medical condition- Patient remains unresponsive and will not open eyes to command.   Patient with increased WOB and using accessory muscles to breathe Explained to family course of therapy and the modalities  Patient with Progressive multiorgan failure with a very high probablity of a very minimal chance of meaningful recovery despite all aggressive and optimal medical therapy.    She understands the situation. She is in active dying process Pneumonia and UTi with kidney failure  She has consented and agreed to DNR/DNI status She would like to contact other sisters and decide on possible comfort care measures  All questions answered  Additional CC time 27 mins   Doris Curry, M.D.  [facility]  Medical Director [facility] Medical Director Medical [facility] Cardio-Pulmonary Department

## 2024-01-16 DIAGNOSIS — A419 Sepsis, unspecified organism: Secondary | ICD-10-CM | POA: Diagnosis present

## 2024-01-16 MED ORDER — LORAZEPAM 2 MG/ML IJ SOLN
1.0000 mg | INTRAMUSCULAR | Status: DC | PRN
  Administered 2024-01-16: 1 mg via INTRAVENOUS
  Filled 2024-01-16: qty 1

## 2024-01-17 LAB — URINE CULTURE: Culture: <10000 — AB

## 2024-01-20 ENCOUNTER — Ambulatory Visit: Admitting: Physician Assistant

## 2024-02-05 NOTE — Death Summary Note (Signed)
 DEATH SUMMARY   Patient Details  Name: Natajah Derderian MRN: [id number] DOB: 03/15/1946  Admission/Discharge Information   Admit Date:  2024/01/28  Date of Death: Date of Death: 29-Jan-2024  Time of Death: Time of Death: 0248  Length of Stay: 0  Referring Physician: [place] [facility]) for Hospitalization  Altered mental status  Diagnoses  Preliminary cause of death: severe sepsis with septic shock secondary to UTI with multiorgan failure Secondary Diagnoses (including complications and co-morbidities):  Principal Problem:   Severe sepsis with acute organ dysfunction [facility]) Active Problems:   Septic shock [facility])   Altered mental status  Brief Hospital Course (including significant findings, care, treatment, and services provided and events leading to death)  Doris Curry is a 78 y.o. year old female who was brought in by ambulance after becoming unresponsive at an outpatient orthopedics appointment. Glucose was normal.  Satting in the 60s on room air, is breathing spontaneously.  Not responsive to noxious stimuli.     Per later chart review, recently admitted 01/02/2024-01/05/2024 after presenting with dysuria and confusion.  Found to have UTI, met sepsis criteria, hypotensive, responded to fluids.  Urine culture grew Klebsiella, discharged on Augmentin .     Also recently complained of neck pain, MRI of C-spine noted edema throughout the dens involving lateral masses of C1 and C2.     CT showed type II fracture of the dens with posterior and right lateral displacement of fracture fragment as well as lateral masses of C1.   Neurosurgery consulted, recommended conservative management with neck collar, as needed pain meds, outpatient management.   Note that she has been somewhat confused compared to baseline over the past few days. Patient has not been eating and also choking on food. Code stroke was called and patient evaluated by neurologist at the bedside.  Per  neurologist, patient's acute altered mental status was likely toxic metabolic in the setting of severe sepsis with shock rather than stroke. She was already DNR/DNI and was extremely acidotic iso severe sepsis. Recommended palliative care consultation.   Goals of care was discussed with family at bedside and status relayed to them in terms that they could understand. Reviewed patient's worsening lab, ABGs, vital signs including unstable HR and blood pressure requiring multiple pressors  and overall poor prognosis with the family at the bedside and answered all their question. Discussed prognosis, expected outcome with or without ongoing aggressive treatments and the options for de-escalation of care. Family decided to transitioned patient to comfort care  see IPAL note. Patient was transitioned to comfort care and passed away shortly at 0248am   Pertinent Labs and Studies  Significant Diagnostic Studies CT CHEST ABDOMEN PELVIS WO CONTRAST Result Date: 01-28-2024 CLINICAL DATA:  Shot, nonresponsive, renal failure EXAM: CT CHEST, ABDOMEN AND PELVIS WITHOUT CONTRAST TECHNIQUE: Multidetector CT imaging of the chest, abdomen and pelvis was performed following the standard protocol without IV contrast. RADIATION DOSE REDUCTION: This exam was performed according to the departmental dose-optimization program which includes automated exposure control, adjustment of the mA and/or kV according to patient size and/or use of iterative reconstruction technique. COMPARISON:  Same day chest radiograph and CT 02/26/2017 FINDINGS: CT CHEST FINDINGS Cardiovascular: Small pericardial effusion is new since 02/07/2017. Coronary artery and aortic atherosclerotic calcification. No aortic aneurysm. Mediastinum/Nodes: Fluid column filling the esophagus reaching the thoracic inlet. Small amount of debris in the upper trachea. No lymphadenopathy. Lungs/Pleura: Small left greater than right pleural effusions. Peribronchovascular airspace  opacities in the lower  lobes. No pneumothorax. Musculoskeletal: Remote fracture of the right humerus. No acute fracture. CT ABDOMEN PELVIS FINDINGS Hepatobiliary: No focal liver abnormality is seen. No gallstones, gallbladder wall thickening, or biliary dilatation. Pancreas: Unremarkable. Spleen: Unremarkable. Adrenals/Urinary Tract: Normal adrenal glands. No urinary calculi or hydronephrosis. Foley catheter and gas in the decompressed bladder. Stomach/Bowel: Stomach is within normal limits. No bowel obstruction or bowel wall thickening. Normal appendix. Vascular/Lymphatic: Aortic atherosclerosis. No enlarged abdominal or pelvic lymph nodes. Reproductive: Hysterectomy.  No adnexal mass. Other: Soft tissue thickening along the gluteal cleft. Adjacent 3.4 x 1.9 cm fluid collection in the medial right buttock (series 2/image 123). Musculoskeletal: No acute fracture. IMPRESSION: 1. Small left greater than right pleural effusions. Peribronchovascular airspace opacities in the lower lobes, suspicious pneumonia and/or aspiration. 2. Fluid column filling the esophagus reaching the thoracic inlet. 3. Small pericardial effusion is new since 2018. 4. Soft tissue thickening along the gluteal cleft with 3.4 cm fluid collection in the medial right buttock suspicious for abscess. 5. Aortic Atherosclerosis (ICD10-I70.0). Electronically Signed   By: Norman Gatlin M.D.   On: 01/15/2024 17:34   DG Chest Port 1 View Result Date: 01/15/2024 CLINICAL DATA:  Sepsis EXAM: PORTABLE CHEST 1 VIEW COMPARISON:  01/02/2024 FINDINGS: Atherosclerotic calcification of the aortic arch. Mild enlargement of the cardiopericardial silhouette, without edema. Low lung volumes are present, causing crowding of the pulmonary vasculature. Platelike atelectasis at the right lung base. No blunting of the costophrenic angles. Degenerative glenohumeral arthropathy on the left. IMPRESSION: 1. Low lung volumes, causing crowding of the pulmonary vasculature. 2.  Platelike atelectasis at the right lung base. 3. Mild enlargement of the cardiopericardial silhouette, without edema. 4. Degenerative glenohumeral arthropathy on the left. Electronically Signed   By: Ryan Salvage M.D.   On: 01/15/2024 17:07   CT HEAD CODE STROKE WO CONTRAST Result Date: 01/15/2024 CLINICAL DATA:  Code stroke. Provided history: Neuro deficit, acute, stroke suspected. EXAM: CT HEAD WITHOUT CONTRAST TECHNIQUE: Contiguous axial images were obtained from the base of the skull through the vertex without intravenous contrast. RADIATION DOSE REDUCTION: This exam was performed according to the departmental dose-optimization program which includes automated exposure control, adjustment of the mA and/or kV according to patient size and/or use of iterative reconstruction technique. COMPARISON:  Head CT 11/05/2023. FINDINGS: Brain: Generalized cerebral and cerebellar atrophy. Small chronic lacunar infarct within the left basal ganglia. Background patchy and ill-defined hypoattenuation within the cerebral white matter, nonspecific but compatible with mild chronic small vessel ischemic disease. There is no acute intracranial hemorrhage. No demarcated cortical infarct. No extra-axial fluid collection. No evidence of an intracranial mass. No midline shift. Vascular: No hyperdense vessel.  Atherosclerotic calcifications. Skull: No calvarial fracture or aggressive osseous lesion. Sinuses/Orbits: No mass or acute finding within the imaged orbits. No significant paranasal sinus disease at the imaged levels. ASPECTS ([place]berta Stroke Program Early CT Score) - Ganglionic level infarction (caudate, lentiform nuclei, internal capsule, insula, M1-M3 cortex): 7 - Supraganglionic infarction (M4-M6 cortex): 3 Total score (0-10 with 10 being normal): 10 (when discounting a chronic left basal ganglia lacunar infarct). No evidence of an acute intracranial abnormality. These results were communicated to Dr. Voncile at [facility]  pmon 7/11/2025by text page via the [facility] messaging system. IMPRESSION: 1.  No evidence of an acute intracranial abnormality. 2. Small chronic lacunar infarct in the left basal ganglia, unchanged. 3. Background parenchymal atrophy and mild cerebral white matter chronic small vessel ischemic disease. Electronically Signed   By: Rockey Childs D.O.   On:  01/15/2024 15:22   CT CERVICAL SPINE WO CONTRAST Result Date: 01/03/2024 CLINICAL DATA:  Chronic neck pain. MRI of C-spine on 12/22/2023 showed edema throughout the dens in involving the lateral masses of C1 and C2. EXAM: CT CERVICAL SPINE WITHOUT CONTRAST TECHNIQUE: Multidetector CT imaging of the cervical spine was performed without intravenous contrast. Multiplanar CT image reconstructions were also generated. RADIATION DOSE REDUCTION: This exam was performed according to the departmental dose-optimization program which includes automated exposure control, adjustment of the mA and/or kV according to patient size and/or use of iterative reconstruction technique. COMPARISON:  MRI cervical spine 12/22/2023 and CT 11/05/2023 FINDINGS: Alignment: Posterior and rightward subluxation of C1 in relation to the base of the dens. Skull base and vertebrae: Fracture and possible erosions within the dens. Lytic lucencies versus fractures throughout the lateral masses of C1, the occipital condyles, and the basion. No additional fractures. Soft tissues and spinal canal: No definite spinal canal narrowing. Soft tissue prominence about the atlantoaxial joint may be due to pannus formation, hematoma, or infection. Prevertebral soft tissue swelling similar to prior MRI. Disc levels: Multilevel spondylosis, disc space height loss, degenerative endplate changes greatest at C5-C6 and C6-C7 where it is advanced. Upper chest: No acute abnormality. Other: None. IMPRESSION: Type 2 fracture of the dens with posterior and right lateral displacement of the fracture fragment as well as the  lateral masses of C1. Irregular lucencies within the dens, lateral masses of C1, occipital condyles, and tip of basion may be due to healing resorption from subacute fractures. However inflammatory or crystalline arthropathy or infection could have this appearance. There is soft tissue prominence about the atlantoaxial joint which may represent pannus formation, hematoma, or infection. No definite spinal canal narrowing. Neurosurgery consult is recommended. Consider further evaluation with MRI cervical spine with IV contrast. Critical Value/emergent results were called by telephone at the time of interpretation on 01/03/2024 at 3:52 am to provider Erminio Cone, who verbally acknowledged these results. Electronically Signed   By: Norman Gatlin M.D.   On: 01/03/2024 04:04   DG Chest Portable 1 View Result Date: 01/02/2024 CLINICAL DATA:  Confusion. EXAM: PORTABLE CHEST 1 VIEW COMPARISON:  07/17/2017 FINDINGS: Stable heart size and mediastinal contours. No airspace consolidation. No pneumothorax or pleural effusion. Normal pulmonary vasculature. No acute osseous findings. IMPRESSION: No acute chest findings. Electronically Signed   By: Andrea Gasman M.D.   On: 01/02/2024 22:43   MR CERVICAL SPINE WO CONTRAST Result Date: 12/29/2023 CLINICAL DATA:  Neck pain worse for the past week. EXAM: MRI CERVICAL SPINE WITHOUT CONTRAST TECHNIQUE: Multiplanar, multisequence MR imaging of the cervical spine was performed. No intravenous contrast was administered. COMPARISON:  CT cervical spine 11/05/2023. FINDINGS: Alignment: Straightening and slight reversal of the normal cervical lordosis. Similar trace anterolisthesis of C4 on C5. Vertebrae: Slightly limits evaluation of the upper cervical spine on sagittal T2 images due to artifact. There is diffuse edema throughout the dens also involving the lateral masses of C1 and C2 more pronounced on the right. There is edema at the atlanto dens and bilateral atlantoaxial  articulations. No additional bone marrow edema. Vertebral body heights are maintained. Cord: Normal signal and morphology. Posterior Fossa, vertebral arteries, paraspinal tissues: The posterior fossa is unremarkable. Bilateral vertebral artery flow voids are visualized. There is edema in the soft tissues surrounding the dens. The tectal membrane and posterior longitudinal ligament is intact. Edema within the soft tissues adjacent to the C1-2 level. There is additional prevertebral edema extending from C2 to the  upper thoracic spine. Disc levels: C2-3: No significant spinal canal stenosis. No significant foraminal stenosis. Bilateral facet arthrosis. C3-4: Small disc osteophyte complex. No significant spinal canal stenosis. Bilateral facet arthrosis. Uncovertebral hypertrophy on the left. Mild left foraminal stenosis. C4-5: Disc osteophyte complex eccentric to the right. No significant spinal canal stenosis. Bilateral facet arthrosis. Mild bilateral foraminal stenosis. C5-6: Mild disc height loss. Disc osteophyte complex indents the ventral thecal sac with mild flattening of the ventral cervical cord. Bilateral facet arthrosis. Uncovertebral hypertrophy on the right. Mild right foraminal stenosis. C6-7: Mild disc height loss. Disc osteophyte complex indents the ventral thecal sac with mild flattening of the ventral cervical cord. Bilateral facet arthrosis and uncovertebral hypertrophy. Mild bilateral foraminal stenosis. C7-T1: No significant spinal canal stenosis. Bilateral facet arthrosis. IMPRESSION: Edema throughout the dens and involving the lateral masses of C1 and C2. Edema within the soft tissues surrounding the dens and adjacent to the bilateral atlantoaxial articulations. Recommend correlation with history of trauma and consider CT of the cervical spine for further evaluation. Alternatively, findings could be present in the setting of inflammatory or crystalline arthropathy versus infection. Prevertebral  edema extending from C2 to the upper thoracic spine. Degenerative changes as above. No high-grade spinal canal stenosis or cord compression. No high-grade foraminal stenosis. Electronically Signed   By: Donnice Mania M.D.   On: 12/29/2023 14:28    Microbiology No results found for this or any previous visit (from the past 240 hours).  Lab Basic Metabolic Panel: Recent Labs  Lab 01/15/24 1500  NA 138  K 6.6*  CL 100  CO2 11*  GLUCOSE 136*  BUN 52*  CREATININE 3.64*  CALCIUM  9.4   Liver Function Tests: Recent Labs  Lab 01/15/24 1500  AST 93*  ALT 37  ALKPHOS 173*  BILITOT 0.9  PROT 7.1  ALBUMIN  2.3*   Recent Labs  Lab 01/15/24 1500  LIPASE 38   No results for input(s): AMMONIA in the last 168 hours. CBC: Recent Labs  Lab 01/15/24 1500  WBC 18.1*  NEUTROABS 13.5*  HGB 12.8  HCT 45.5  MCV 102.0*  PLT 479*   Cardiac Enzymes: No results for input(s): CKTOTAL, CKMB, CKMBINDEX, TROPONINI in the last 168 hours. Sepsis Labs: Recent Labs  Lab 01/15/24 1500  WBC 18.1*    Procedures/Operations  None   Almarie DELENA Nose DNP 01-20-24, 4:56 AM

## 2024-02-05 NOTE — ED Notes (Signed)
 Comfort care initiated at this time. All IV drips turned off. Family at bedside.

## 2024-02-05 NOTE — ED Notes (Signed)
 Family went home. Family stated that they want pt's rings left on after pt passes away....SABRASABRA

## 2024-02-05 DEATH — deceased
